# Patient Record
Sex: Female | Born: 1935 | Race: White | Hispanic: No | Marital: Married | Smoking: Never smoker
Health system: Southern US, Community
[De-identification: ages and names within clinical notes are randomized; demographics above are authoritative.]

## PROBLEM LIST (undated history)

## (undated) DIAGNOSIS — D509 Iron deficiency anemia, unspecified: Secondary | ICD-10-CM

## (undated) DIAGNOSIS — I272 Pulmonary hypertension, unspecified: Secondary | ICD-10-CM

## (undated) DIAGNOSIS — I714 Abdominal aortic aneurysm, without rupture, unspecified: Secondary | ICD-10-CM

## (undated) DIAGNOSIS — G4700 Insomnia, unspecified: Secondary | ICD-10-CM

## (undated) DIAGNOSIS — N189 Chronic kidney disease, unspecified: Secondary | ICD-10-CM

## (undated) DIAGNOSIS — M47816 Spondylosis without myelopathy or radiculopathy, lumbar region: Secondary | ICD-10-CM

## (undated) DIAGNOSIS — D649 Anemia, unspecified: Secondary | ICD-10-CM

## (undated) DIAGNOSIS — D259 Leiomyoma of uterus, unspecified: Secondary | ICD-10-CM

## (undated) DIAGNOSIS — E782 Mixed hyperlipidemia: Secondary | ICD-10-CM

## (undated) DIAGNOSIS — I7090 Unspecified atherosclerosis: Secondary | ICD-10-CM

## (undated) DIAGNOSIS — E038 Other specified hypothyroidism: Secondary | ICD-10-CM

## (undated) DIAGNOSIS — M479 Spondylosis, unspecified: Secondary | ICD-10-CM

## (undated) HISTORY — DX: Anemia, unspecified: D64.9

## (undated) HISTORY — DX: Abdominal aortic aneurysm, without rupture, unspecified: I71.40

## (undated) HISTORY — DX: Other specified hypothyroidism: E03.8

## (undated) HISTORY — DX: Iron deficiency anemia, unspecified: D50.9

## (undated) HISTORY — DX: Chronic kidney disease, unspecified: N18.9

## (undated) HISTORY — DX: Spondylosis without myelopathy or radiculopathy, lumbar region: M47.816

## (undated) HISTORY — DX: Leiomyoma of uterus, unspecified: D25.9

## (undated) HISTORY — DX: Insomnia, unspecified: G47.00

## (undated) HISTORY — PX: NO PAST SURGERIES: SHX2092

## (undated) HISTORY — DX: Mixed hyperlipidemia: E78.2

## (undated) HISTORY — DX: Spondylosis, unspecified: M47.9

## (undated) HISTORY — DX: Pulmonary hypertension, unspecified: I27.20

## (undated) HISTORY — DX: Unspecified atherosclerosis: I70.90

---

## 2015-08-06 DIAGNOSIS — R5381 Other malaise: Secondary | ICD-10-CM

## 2015-08-06 DIAGNOSIS — I10 Essential (primary) hypertension: Secondary | ICD-10-CM

## 2015-08-06 HISTORY — DX: Other malaise: R53.81

## 2015-08-06 HISTORY — DX: Essential (primary) hypertension: I10

## 2016-02-26 DIAGNOSIS — R634 Abnormal weight loss: Secondary | ICD-10-CM

## 2016-02-26 HISTORY — DX: Abnormal weight loss: R63.4

## 2018-03-15 DIAGNOSIS — K219 Gastro-esophageal reflux disease without esophagitis: Secondary | ICD-10-CM

## 2018-03-15 HISTORY — DX: Gastro-esophageal reflux disease without esophagitis: K21.9

## 2018-03-17 DIAGNOSIS — E038 Other specified hypothyroidism: Secondary | ICD-10-CM | POA: Insufficient documentation

## 2020-07-29 DIAGNOSIS — I700 Atherosclerosis of aorta: Secondary | ICD-10-CM

## 2020-07-29 HISTORY — DX: Atherosclerosis of aorta: I70.0

## 2020-08-13 DIAGNOSIS — M51369 Other intervertebral disc degeneration, lumbar region without mention of lumbar back pain or lower extremity pain: Secondary | ICD-10-CM | POA: Insufficient documentation

## 2020-08-13 DIAGNOSIS — M5136 Other intervertebral disc degeneration, lumbar region: Secondary | ICD-10-CM | POA: Insufficient documentation

## 2020-08-13 DIAGNOSIS — F419 Anxiety disorder, unspecified: Secondary | ICD-10-CM

## 2020-08-13 DIAGNOSIS — F32A Depression, unspecified: Secondary | ICD-10-CM

## 2020-08-13 HISTORY — DX: Depression, unspecified: F32.A

## 2020-08-13 HISTORY — DX: Anxiety disorder, unspecified: F41.9

## 2020-08-13 HISTORY — DX: Other intervertebral disc degeneration, lumbar region without mention of lumbar back pain or lower extremity pain: M51.369

## 2020-08-16 ENCOUNTER — Emergency Department (HOSPITAL_COMMUNITY)
Admission: EM | Admit: 2020-08-16 | Discharge: 2020-08-17 | Disposition: A | Discharge status: Home / Self Care | Payer: Medicare HMO | Attending: Emergency Medicine | Admitting: Emergency Medicine

## 2020-08-16 ENCOUNTER — Emergency Department (HOSPITAL_COMMUNITY): Payer: Medicare HMO

## 2020-08-16 DIAGNOSIS — R42 Dizziness and giddiness: Secondary | ICD-10-CM | POA: Insufficient documentation

## 2020-08-16 DIAGNOSIS — R5383 Other fatigue: Secondary | ICD-10-CM | POA: Insufficient documentation

## 2020-08-16 DIAGNOSIS — R002 Palpitations: Secondary | ICD-10-CM | POA: Diagnosis not present

## 2020-08-16 LAB — CBC WITH DIFFERENTIAL/PLATELET
Abs Immature Granulocytes: 0.02 10*3/uL (ref 0.00–0.07)
Basophils Absolute: 0.1 10*3/uL (ref 0.0–0.1)
Basophils Relative: 1 %
Eosinophils Absolute: 0 10*3/uL (ref 0.0–0.5)
Eosinophils Relative: 1 %
HCT: 37.8 % (ref 36.0–46.0)
Hemoglobin: 12.6 g/dL (ref 12.0–15.0)
Immature Granulocytes: 0 %
Lymphocytes Relative: 16 %
Lymphs Abs: 1.1 10*3/uL (ref 0.7–4.0)
MCH: 30.4 pg (ref 26.0–34.0)
MCHC: 33.3 g/dL (ref 30.0–36.0)
MCV: 91.1 fL (ref 80.0–100.0)
Monocytes Absolute: 0.4 10*3/uL (ref 0.1–1.0)
Monocytes Relative: 6 %
Neutro Abs: 5.3 10*3/uL (ref 1.7–7.7)
Neutrophils Relative %: 76 %
Platelets: 193 10*3/uL (ref 150–400)
RBC: 4.15 MIL/uL (ref 3.87–5.11)
RDW: 12.8 % (ref 11.5–15.5)
WBC: 7 10*3/uL (ref 4.0–10.5)
nRBC: 0 % (ref 0.0–0.2)

## 2020-08-16 LAB — CBC
HCT: 37.2 % (ref 36.0–46.0)
Hemoglobin: 12.3 g/dL (ref 12.0–15.0)
MCH: 30.3 pg (ref 26.0–34.0)
MCHC: 33.1 g/dL (ref 30.0–36.0)
MCV: 91.6 fL (ref 80.0–100.0)
Platelets: 7 10*3/uL — CL (ref 150–400)
RBC: 4.06 MIL/uL (ref 3.87–5.11)
RDW: 12.6 % (ref 11.5–15.5)
WBC: 3.2 10*3/uL — ABNORMAL LOW (ref 4.0–10.5)
nRBC: 0 % (ref 0.0–0.2)

## 2020-08-16 LAB — BASIC METABOLIC PANEL
Anion gap: 6 (ref 5–15)
BUN: 25 mg/dL — ABNORMAL HIGH (ref 8–23)
CO2: 24 mmol/L (ref 22–32)
Calcium: 8.7 mg/dL — ABNORMAL LOW (ref 8.9–10.3)
Chloride: 108 mmol/L (ref 98–111)
Creatinine, Ser: 0.91 mg/dL (ref 0.44–1.00)
GFR, Estimated: >60 mL/min (ref >60)
Glucose, Bld: 159 mg/dL — ABNORMAL HIGH (ref 70–99)
Potassium: 4.1 mmol/L (ref 3.5–5.1)
Sodium: 138 mmol/L (ref 135–145)

## 2020-08-16 LAB — TROPONIN I (HIGH SENSITIVITY)
Troponin I (High Sensitivity): 8 ng/L (ref <18)
Troponin I (High Sensitivity): 13 ng/L (ref <18)

## 2020-08-16 LAB — CBG MONITORING, ED: Glucose-Capillary: 137 mg/dL — ABNORMAL HIGH (ref 70–99)

## 2020-08-16 MED ORDER — SODIUM CHLORIDE 0.9 % IV SOLN: INTRAVENOUS | Status: DC

## 2020-08-16 MED ORDER — SODIUM CHLORIDE 0.9 % IV BOLUS
1000.0000 mL | Freq: Once | INTRAVENOUS | Status: AC
  Administered 2020-08-16: 1000 mL via INTRAVENOUS

## 2020-08-16 NOTE — ED Notes (Signed)
Pure wick placed.

## 2020-08-16 NOTE — Discharge Instructions (Addendum)
Your blood work here was reassuring.  I suspect that you had a vagal episode at home.  The initial ambulance EKG showed some concerning changes while your blood pressure is low.  By the time he got here, both your blood pressure and the EKG were completely normal.  Your cardiac work-up here was also reassuring, as we discussed.  We talked about either being admitted to the hospital for stress testing here or going home with cardiology follow-up and getting stress testing as an outpatient.  He wished to go home, which in the setting of your normal EKG here and reassuring work-up I believe is reasonable.  I have placed an ambulatory referral to cardiology in [place], but I would also call their office yourself to try to establish follow-up as soon as possible.  Please come back to the emergency department if you develop chest pain or shortness of breath.

## 2020-08-16 NOTE — ED Triage Notes (Signed)
BIB [facility] after reporting generalized weakness and heart palpitations. EMS reports initial pressure of 94 palpated and pt was pale. No chest pain but EMS noted depression in leads 4,5 and 6. Code STEMI called and in coded. EMS administered 324mg  aspirin and 1L NS.   Pt A&Ox4 and reports no chest pain at this time. Bilateral 18 g Ivs placed by EMS. Last VS 188/84, HR 94, CBG 201. No significant medical hx, allergies, medications.

## 2020-08-16 NOTE — ED Provider Notes (Signed)
[facility] EMERGENCY DEPARTMENT Provider Note   CSN: [id number] Arrival date & time: 08/16/20  2040     History Chief Complaint  Patient presents with   Code STEMI    Rachel Bass is a 86 y.o. female.  HPI  86 year old female with no reported past medical history presenting to the emergency department due to concern for a myocardial infarction.  Patient reports that her husband recently left the hospital.  She states that she has been caring for him a great deal at home.  She states that this is made her quite tired, especially in the last 2 weeks.  She states that today, she did not eat or drink very well.  She states that when she stood up, she felt her heart was racing, she felt flushed, and she felt she was going to pass out.  She states that she told her daughter that she felt she was dying, so her daughter called 911.  EMS report that on their arrival, the patient was pale and her blood pressure was 95 systolic.  They report that their first EKG was concerning for ST depressions with possibly some elevation, but after they reported to their dispatcher no code STEMI was activated.  Currently the emergency department, patient states that she feels quite well.  She feels much better after EMS gave her some fluids.  EMS also gave her 324 mg of aspirin.  Patient reports that she feels slightly tired, but overall feels much improved.  She denies any chest pain, shortness of breath.  She denies any abdominal pain, nausea, vomiting, or diarrhea.  No past medical history on file.  There are no problems to display for this patient.  OB History   No obstetric history on file.    No family history on file.   Home Medications Prior to Admission medications   Not on File    Allergies    Patient has no allergy information on record.  Review of Systems   Review of Systems  Constitutional:  Positive for chills. Negative for fever.  HENT:  Negative for ear pain and  sore throat.   Eyes:  Negative for pain and visual disturbance.  Respiratory:  Negative for cough and shortness of breath.   Cardiovascular:  Positive for palpitations. Negative for chest pain.  Gastrointestinal:  Negative for abdominal pain and vomiting.  Genitourinary:  Negative for dysuria and hematuria.  Musculoskeletal:  Negative for arthralgias and back pain.  Skin:  Negative for color change and rash.  Neurological:  Positive for light-headedness. Negative for seizures and syncope.  All other systems reviewed and are negative.  Physical Exam Updated Vital Signs BP (!) 189/82   Pulse 66   Temp 98.7 F (37.1 C) (Oral)   Resp 18   SpO2 96%   Physical Exam Vitals and nursing note reviewed.  Constitutional:      General: She is not in acute distress.    Appearance: She is well-developed. She is not ill-appearing or toxic-appearing.  HENT:     Head: Normocephalic and atraumatic.  Eyes:     Conjunctiva/sclera: Conjunctivae normal.  Cardiovascular:     Rate and Rhythm: Normal rate and regular rhythm.     Heart sounds: No murmur heard. Pulmonary:     Effort: Pulmonary effort is normal. No respiratory distress.     Breath sounds: Normal breath sounds.  Abdominal:     Palpations: Abdomen is soft.     Tenderness: There is no abdominal  tenderness.  Musculoskeletal:     Cervical back: Neck supple.  Skin:    General: Skin is warm and dry.     Capillary Refill: Capillary refill takes less than 2 seconds.  Neurological:     Mental Status: She is alert and oriented to person, place, and time.   ED Results / Procedures / Treatments   Labs (all labs ordered are listed, but only abnormal results are displayed) Labs Reviewed  BASIC METABOLIC PANEL - Abnormal; Notable for the following components:      Result Value   Glucose, Bld 159 (*)    BUN 25 (*)    Calcium 8.7 (*)    All other components within normal limits  CBC - Abnormal; Notable for the following components:   WBC  3.2 (*)    Platelets 7 (*)    All other components within normal limits  CBG MONITORING, ED - Abnormal; Notable for the following components:   Glucose-Capillary 137 (*)    All other components within normal limits  CBC WITH DIFFERENTIAL/PLATELET  TROPONIN I (HIGH SENSITIVITY)  TROPONIN I (HIGH SENSITIVITY)   EKG EKG Interpretation  Date/Time:  Saturday August 16 2020 20:46:32 EDT Ventricular Rate:  89 PR Interval:  198 QRS Duration: 102 QT Interval:  348 QTC Calculation: 424 R Axis:   65 Text Interpretation: Sinus rhythm Confirmed by Alvester Chou [phone]) on 08/16/2020 9:49:09 PM  Radiology DG Chest Portable 1 View  Result Date: 08/16/2020 CLINICAL DATA:  Code STEMI EXAM: PORTABLE CHEST 1 VIEW COMPARISON:  06/26/2020 FINDINGS: Lungs are clear.  No pleural effusion or pneumothorax. The heart is normal in size. IMPRESSION: No evidence of acute cardiopulmonary disease. Electronically Signed   By: Charline Bills M.D.   On: 08/16/2020 21:26    Procedures Procedures   Medications Ordered in ED Medications  sodium chloride 0.9 % bolus 1,000 mL (0 mLs Intravenous Stopped 08/16/20 2242)    And  0.9 %  sodium chloride infusion ( Intravenous New Bag/Given 08/16/20 2242)   ED Course  I have reviewed the triage vital signs and the nursing notes.  Pertinent labs & imaging results that were available during my care of the patient were reviewed by me and considered in my medical decision making (see chart for details).  MDM Rules/Calculators/A&P                           86 year old female with no reported past medical history presenting to the emergency department with generalized fatigue and malaise for the past 2 weeks in the setting of caring for her husband.  When she stood up today, she felt palpitations, lightheaded.  EMS arrived and reported some hypotension and a flushed appearance.  He also reports some possible ST depressions in the lateral leads on initial ECG.  On arrival  to our ED, vital signs reviewed she is normotensive and not tachycardic.  She appears overall well.  She states that she is currently asymptomatic.  We will obtain a CBC, BMP, delta troponin.  Initial EKG shows no anatomic ST elevation or ST depression that would be concerning for acute ischemia.  Will administer some fluids as well, as it improve the patient's symptoms earlier.  Differentials considered include ACS, vasovagal episode, systemic infection.  She had her thyroid checked 3 days ago, and it was normal.  Given that she has no chest pain, no tachycardia, no hypoxia, no historical risk factors, no concern for pulmonary embolism.  Initial CBC shows a platelet count of 7, but 3 days ago was 240 and I suspect that this is a spurious value.  We will resend a CBC.  Metabolic panel within acceptable limits.  Initial troponin negative.  Repeat troponin negative.  On my repeat evaluation, the patient remains asymptomatic.  She never had any chest pain or shortness of breath.  Given that the patient had some changes in the lateral leads during a likely vagal episode at home, I offered the patient admission to the hospital for provocative testing as she has never had a cardiac evaluation that I can see in our system.  The patient did not wish to be admitted and preferred to go home.  Given her reassuring cardiac work-up here and the fact that she never had any chest pain or shortness of breath, I believe that this is reasonable.  Patient reports that her husband follows with a cardiologist in [place] and she would like to call them to be seen this week.  I believe that discharge home with close specialty follow-up is a reasonable plan given her reassuring work-up and presentation.  I have also placed an ambulatory care referral as well to cardiology.  Strict return precautions discussed with the patient, given both verbal and written format.  Final Clinical Impression(s) / ED Diagnoses Final diagnoses:   Lightheadedness    Rx / DC Orders ED Discharge Orders     None        Lenard Lance, MD 08/17/20 0010    Terald Sleeper, MD 08/17/20 [phone]

## 2020-08-17 NOTE — ED Notes (Signed)
E-signature pad unavailable at time of pt discharge. This RN discussed discharge materials with pt and answered all pt questions. Pt stated understanding of discharge material. ? ?

## 2020-08-17 NOTE — ED Notes (Signed)
Daughter-in-law on the way from [place] to pick up pt.

## 2020-08-17 NOTE — ED Notes (Signed)
RN attempted to call Fayrene Fearing, pt's husband, per pt discharge. Left a voicemail to return a call to the [facility] and provided the respective phone number.

## 2020-08-17 NOTE — ED Notes (Signed)
Attempted to call Marylene Land, pt's daughter.

## 2020-08-17 NOTE — ED Notes (Signed)
Daughter called and is unable to pick up the patient anymore. Called husband. No answer; voicemail left.

## 2020-08-17 NOTE — ED Notes (Signed)
RN spoke with Marylene Land, pt's daughter, on the phone per pt discharge. Daughter stated to this RN that she will come and pick up pt.

## 2020-08-22 ENCOUNTER — Emergency Department (HOSPITAL_COMMUNITY)
Admission: EM | Admit: 2020-08-22 | Discharge: 2020-08-23 | Disposition: A | Discharge status: Home / Self Care | Payer: Medicare HMO | Attending: Emergency Medicine | Admitting: Emergency Medicine

## 2020-08-22 ENCOUNTER — Other Ambulatory Visit: Payer: Self-pay

## 2020-08-22 ENCOUNTER — Emergency Department (HOSPITAL_COMMUNITY): Payer: Medicare HMO

## 2020-08-22 ENCOUNTER — Encounter (HOSPITAL_COMMUNITY): Payer: Self-pay

## 2020-08-22 DIAGNOSIS — Y92002 Bathroom of unspecified non-institutional (private) residence single-family (private) house as the place of occurrence of the external cause: Secondary | ICD-10-CM | POA: Insufficient documentation

## 2020-08-22 DIAGNOSIS — S0990XA Unspecified injury of head, initial encounter: Secondary | ICD-10-CM | POA: Diagnosis not present

## 2020-08-22 DIAGNOSIS — R55 Syncope and collapse: Secondary | ICD-10-CM

## 2020-08-22 DIAGNOSIS — S4992XA Unspecified injury of left shoulder and upper arm, initial encounter: Secondary | ICD-10-CM | POA: Diagnosis present

## 2020-08-22 DIAGNOSIS — S42412A Displaced simple supracondylar fracture without intercondylar fracture of left humerus, initial encounter for closed fracture: Secondary | ICD-10-CM | POA: Diagnosis not present

## 2020-08-22 DIAGNOSIS — Z20822 Contact with and (suspected) exposure to covid-19: Secondary | ICD-10-CM | POA: Diagnosis not present

## 2020-08-22 DIAGNOSIS — M25522 Pain in left elbow: Secondary | ICD-10-CM | POA: Diagnosis not present

## 2020-08-22 DIAGNOSIS — I951 Orthostatic hypotension: Secondary | ICD-10-CM | POA: Diagnosis not present

## 2020-08-22 DIAGNOSIS — W1812XA Fall from or off toilet with subsequent striking against object, initial encounter: Secondary | ICD-10-CM | POA: Diagnosis not present

## 2020-08-22 DIAGNOSIS — S098XXA Other specified injuries of head, initial encounter: Secondary | ICD-10-CM

## 2020-08-22 DIAGNOSIS — W19XXXA Unspecified fall, initial encounter: Secondary | ICD-10-CM

## 2020-08-22 LAB — BASIC METABOLIC PANEL
Anion gap: 9 (ref 5–15)
BUN: 23 mg/dL (ref 8–23)
CO2: 24 mmol/L (ref 22–32)
Calcium: 9 mg/dL (ref 8.9–10.3)
Chloride: 100 mmol/L (ref 98–111)
Creatinine, Ser: 0.92 mg/dL (ref 0.44–1.00)
GFR, Estimated: >60 mL/min (ref >60)
Glucose, Bld: 99 mg/dL (ref 70–99)
Potassium: 3.4 mmol/L — ABNORMAL LOW (ref 3.5–5.1)
Sodium: 133 mmol/L — ABNORMAL LOW (ref 135–145)

## 2020-08-22 LAB — CBC
HCT: 41.5 % (ref 36.0–46.0)
Hemoglobin: 14.1 g/dL (ref 12.0–15.0)
MCH: 30.6 pg (ref 26.0–34.0)
MCHC: 34 g/dL (ref 30.0–36.0)
MCV: 90 fL (ref 80.0–100.0)
Platelets: 198 10*3/uL (ref 150–400)
RBC: 4.61 MIL/uL (ref 3.87–5.11)
RDW: 12.5 % (ref 11.5–15.5)
WBC: 8.4 10*3/uL (ref 4.0–10.5)
nRBC: 0 % (ref 0.0–0.2)

## 2020-08-22 LAB — RESP PANEL BY RT-PCR (FLU A&B, COVID) ARPGX2
Influenza A by PCR: NEGATIVE
Influenza B by PCR: NEGATIVE
SARS Coronavirus 2 by RT PCR: NEGATIVE

## 2020-08-22 LAB — TROPONIN I (HIGH SENSITIVITY): Troponin I (High Sensitivity): 20 ng/L — ABNORMAL HIGH (ref <18)

## 2020-08-22 MED ORDER — SODIUM CHLORIDE 0.9 % IV BOLUS
500.0000 mL | Freq: Once | INTRAVENOUS | Status: AC
  Administered 2020-08-22: 500 mL via INTRAVENOUS

## 2020-08-22 MED ORDER — ACETAMINOPHEN 325 MG PO TABS
650.0000 mg | ORAL_TABLET | Freq: Once | ORAL | Status: AC
  Administered 2020-08-22: 650 mg via ORAL
  Filled 2020-08-22: qty 2

## 2020-08-22 NOTE — ED Notes (Signed)
Transported to radiology at this time 

## 2020-08-22 NOTE — ED Provider Notes (Signed)
[facility] EMERGENCY DEPARTMENT Provider Note   CSN: [id number] Arrival date & time: 08/22/20  1833     History Chief Complaint  Patient presents with   Near Syncope    Rachel Bass is a 86 y.o. female.  Patient is a 86 yo female presenting for fall. Patient states she stood up from the seated position, attempted to walk to the bathroom, when she felt light headed, dizzy, and fell to the ground. Denies LOC. Admits to head trauma, fell backwards, with pain at back of head. No blood thinners. Admits to pain in left elbow. Patient states over the past three weeks she has had intermittent dizziness with standing and ambulating. States her husband has been ill since December of this year and she has been worried, eating less, and has lost 32 lbs (134 to 102) in the past 8 months. Denies any abdominal pain, nausea, vomiting, night sweats, or hx of cancer. Denies black or blood stools. Denies recent URI symptoms or illness.   The history is provided by the patient. No language interpreter was used.  Near Syncope Chronicity: 3 months. The problem has been resolved. Pertinent negatives include no chest pain, no abdominal pain and no shortness of breath.      History reviewed. No pertinent past medical history.  There are no problems to display for this patient.   History reviewed. No pertinent surgical history.   OB History   No obstetric history on file.     No family history on file.  Social History   Tobacco Use   Smoking status: Never   Smokeless tobacco: Never  Substance Use Topics   Alcohol use: Never   Drug use: Never    Home Medications Prior to Admission medications   Not on File    Allergies    Patient has no known allergies.  Review of Systems   Review of Systems  Constitutional:  Negative for chills and fever.  HENT:  Negative for ear pain and sore throat.   Eyes:  Negative for pain and visual disturbance.  Respiratory:  Negative  for cough and shortness of breath.   Cardiovascular:  Positive for near-syncope. Negative for chest pain and palpitations.  Gastrointestinal:  Negative for abdominal pain and vomiting.  Genitourinary:  Negative for dysuria and hematuria.  Musculoskeletal:  Negative for arthralgias and back pain.  Skin:  Negative for color change and rash.  Neurological:  Positive for dizziness, syncope (near) and light-headedness. Negative for seizures.  All other systems reviewed and are negative.  Physical Exam Updated Vital Signs BP (!) 158/89   Pulse 87   Temp 97.8 F (36.6 C) (Oral)   Resp 14   Ht 4\' 11"  (1.499 m)   Wt 47.2 kg   SpO2 99%   BMI 21.01 kg/m   Physical Exam Vitals and nursing note reviewed.  Constitutional:      General: She is not in acute distress.    Appearance: She is well-developed.  HENT:     Head: Normocephalic and atraumatic.  Eyes:     Conjunctiva/sclera: Conjunctivae normal.  Cardiovascular:     Rate and Rhythm: Normal rate and regular rhythm.     Heart sounds: No murmur heard. Pulmonary:     Effort: Pulmonary effort is normal. No respiratory distress.     Breath sounds: Normal breath sounds.  Abdominal:     Palpations: Abdomen is soft.     Tenderness: There is no abdominal tenderness.  Musculoskeletal:  Cervical back: Neck supple.  Skin:    General: Skin is warm and dry.  Neurological:     Mental Status: She is alert.     GCS: GCS eye subscore is 4. GCS verbal subscore is 5. GCS motor subscore is 6.     Cranial Nerves: Cranial nerves are intact.     Sensory: Sensation is intact.     Motor: Motor function is intact.     Coordination: Coordination is intact.    ED Results / Procedures / Treatments   Labs (all labs ordered are listed, but only abnormal results are displayed) Labs Reviewed  RESP PANEL BY RT-PCR (FLU A&B, COVID) [id number]  BASIC METABOLIC PANEL  CBC  TROPONIN I (HIGH SENSITIVITY)    EKG None  Radiology No results  found.  Procedures Procedures   Medications Ordered in ED Medications  sodium chloride 0.9 % bolus 500 mL (has no administration in time range)    ED Course  I have reviewed the triage vital signs and the nursing notes.  Pertinent labs & imaging results that were available during my care of the patient were reviewed by me and considered in my medical decision making (see chart for details).    MDM Rules/Calculators/A&P                           7:12 PM 86 yo female presenting for episode of light headedness and dizziness that occurred upon standing resulting in fall with blunt head trauma. No blood thinners. On exam patient is Aox3, no acute distress, febrile, with stable vitals. No neurovascular deficits. No evidence of head trauma. No midline spinal pain. No gross deformities. Tenderness to palpation or left elbow.     -CT head and neck demonstrates no acute process. Stable EKG, troponin, electrolytes, and CXR. Patient's symtpoms occurred with standing after not eating well for the last 6 months due to concerns for her husbands health resulting in a 32 lb weight loss. Likely postural hypotension. Fluids given and recommendations for close follow up with cardiology. Patient has established appointment for September 9th for echocardiogram.   -Xray left elbow demonstrates minimally angulated supracondylar fracture. Posterior splint placed with sling and recommendations for close follow up with orthopedic surgery Monday morning.   Patient in no distress and overall condition improved here in the ED. Detailed discussions were had with the patient regarding current findings, and need for close f/u with PCP or on call doctor. The patient has been instructed to return immediately if the symptoms worsen in any way for re-evaluation. Patient verbalized understanding and is in agreement with current care plan. All questions answered prior to discharge.     Final Clinical Impression(s) / ED  Diagnoses Final diagnoses:  Closed supracondylar fracture of left humerus, initial encounter  Near syncope  Orthostatic hypotension  Fall, initial encounter  Blunt head trauma, initial encounter    Rx / DC Orders ED Discharge Orders     None        Franne Forts, DO 08/23/20 0105

## 2020-08-22 NOTE — ED Notes (Signed)
Ortho tech called a this time

## 2020-08-22 NOTE — ED Notes (Signed)
Received verbal report from Alana B RN at this time °

## 2020-08-22 NOTE — ED Triage Notes (Signed)
Pt arrived via [facility] for c/o dizziness. Pt was on the toilet and got dizzy and fell and hit her head and injured left shoulder. Pt denies LOC. Pt denies blood thinners. Pt is A&Ox4. Per EMS pt initial bp was 200/100. Per EMS initial EKG looked like a STEMI, but has since changed to NSR.

## 2020-08-22 NOTE — ED Notes (Signed)
Provider at bedside

## 2020-08-22 NOTE — ED Notes (Signed)
Back from radiology.

## 2020-08-22 NOTE — ED Notes (Signed)
Radiology at bedside at this time.

## 2020-08-23 LAB — TROPONIN I (HIGH SENSITIVITY): Troponin I (High Sensitivity): 20 ng/L — ABNORMAL HIGH (ref <18)

## 2020-08-23 NOTE — ED Notes (Signed)
Spoke to charge and made aware unable to reach anyone but the daughter. Pt does not qualify for PTAR. Advised to call daughter back and advised someone needed to come pick pt up

## 2020-08-23 NOTE — ED Notes (Signed)
Per charge pt d/c to waiting room waiting on transportation at this time

## 2020-08-23 NOTE — ED Notes (Signed)
Attempting to reach daughter. First call went to rang and then went to voicemail. Subsequent calls are now going directly to voicemail

## 2020-08-23 NOTE — ED Notes (Signed)
Reached daughter in reference picking up

## 2020-08-23 NOTE — ED Notes (Signed)
Ortho tech at bedside 

## 2020-08-23 NOTE — ED Notes (Signed)
Regenia Skeeter on the phone and advised that the pt was up for d/c at this time

## 2020-08-23 NOTE — Progress Notes (Signed)
Orthopedic Tech Progress Note Patient Details:  Rachel Bass 12-Mar-1934 [id number]  Ortho Devices Type of Ortho Device: Long arm splint, Shoulder immobilizer Ortho Device/Splint Location: LUE Ortho Device/Splint Interventions: Ordered, Application, Adjustment   Post Interventions Patient Tolerated: Well Instructions Provided: Adjustment of device, Care of device, Poper ambulation with device  Rachel Bass 08/23/2020, 12:27 AM

## 2020-08-23 NOTE — ED Notes (Signed)
Attempting to locate family to come pick pt up for d/c

## 2020-08-23 NOTE — ED Notes (Signed)
Advised to call daughter-in-law Larena Sox [phone]. Attempted to reach her at this time in reference to picking up pt for d/c. No answer

## 2020-08-25 DIAGNOSIS — S42402S Unspecified fracture of lower end of left humerus, sequela: Secondary | ICD-10-CM

## 2020-08-25 HISTORY — DX: Unspecified fracture of lower end of left humerus, sequela: S42.402S

## 2020-09-22 ENCOUNTER — Ambulatory Visit: Payer: Medicare HMO | Admitting: Cardiology

## 2020-09-23 DIAGNOSIS — R42 Dizziness and giddiness: Secondary | ICD-10-CM | POA: Insufficient documentation

## 2020-09-23 HISTORY — DX: Dizziness and giddiness: R42

## 2021-02-19 DIAGNOSIS — I351 Nonrheumatic aortic (valve) insufficiency: Secondary | ICD-10-CM

## 2021-02-19 DIAGNOSIS — I340 Nonrheumatic mitral (valve) insufficiency: Secondary | ICD-10-CM

## 2021-02-19 DIAGNOSIS — I361 Nonrheumatic tricuspid (valve) insufficiency: Secondary | ICD-10-CM

## 2021-12-23 IMAGING — DX DG CHEST 1V PORT
1 series · 1 of 1 positions shown · non-contrast
Comparison: 06/26/2020

CLINICAL DATA: Code STEMI

EXAM:
PORTABLE CHEST 1 VIEW

[chest]
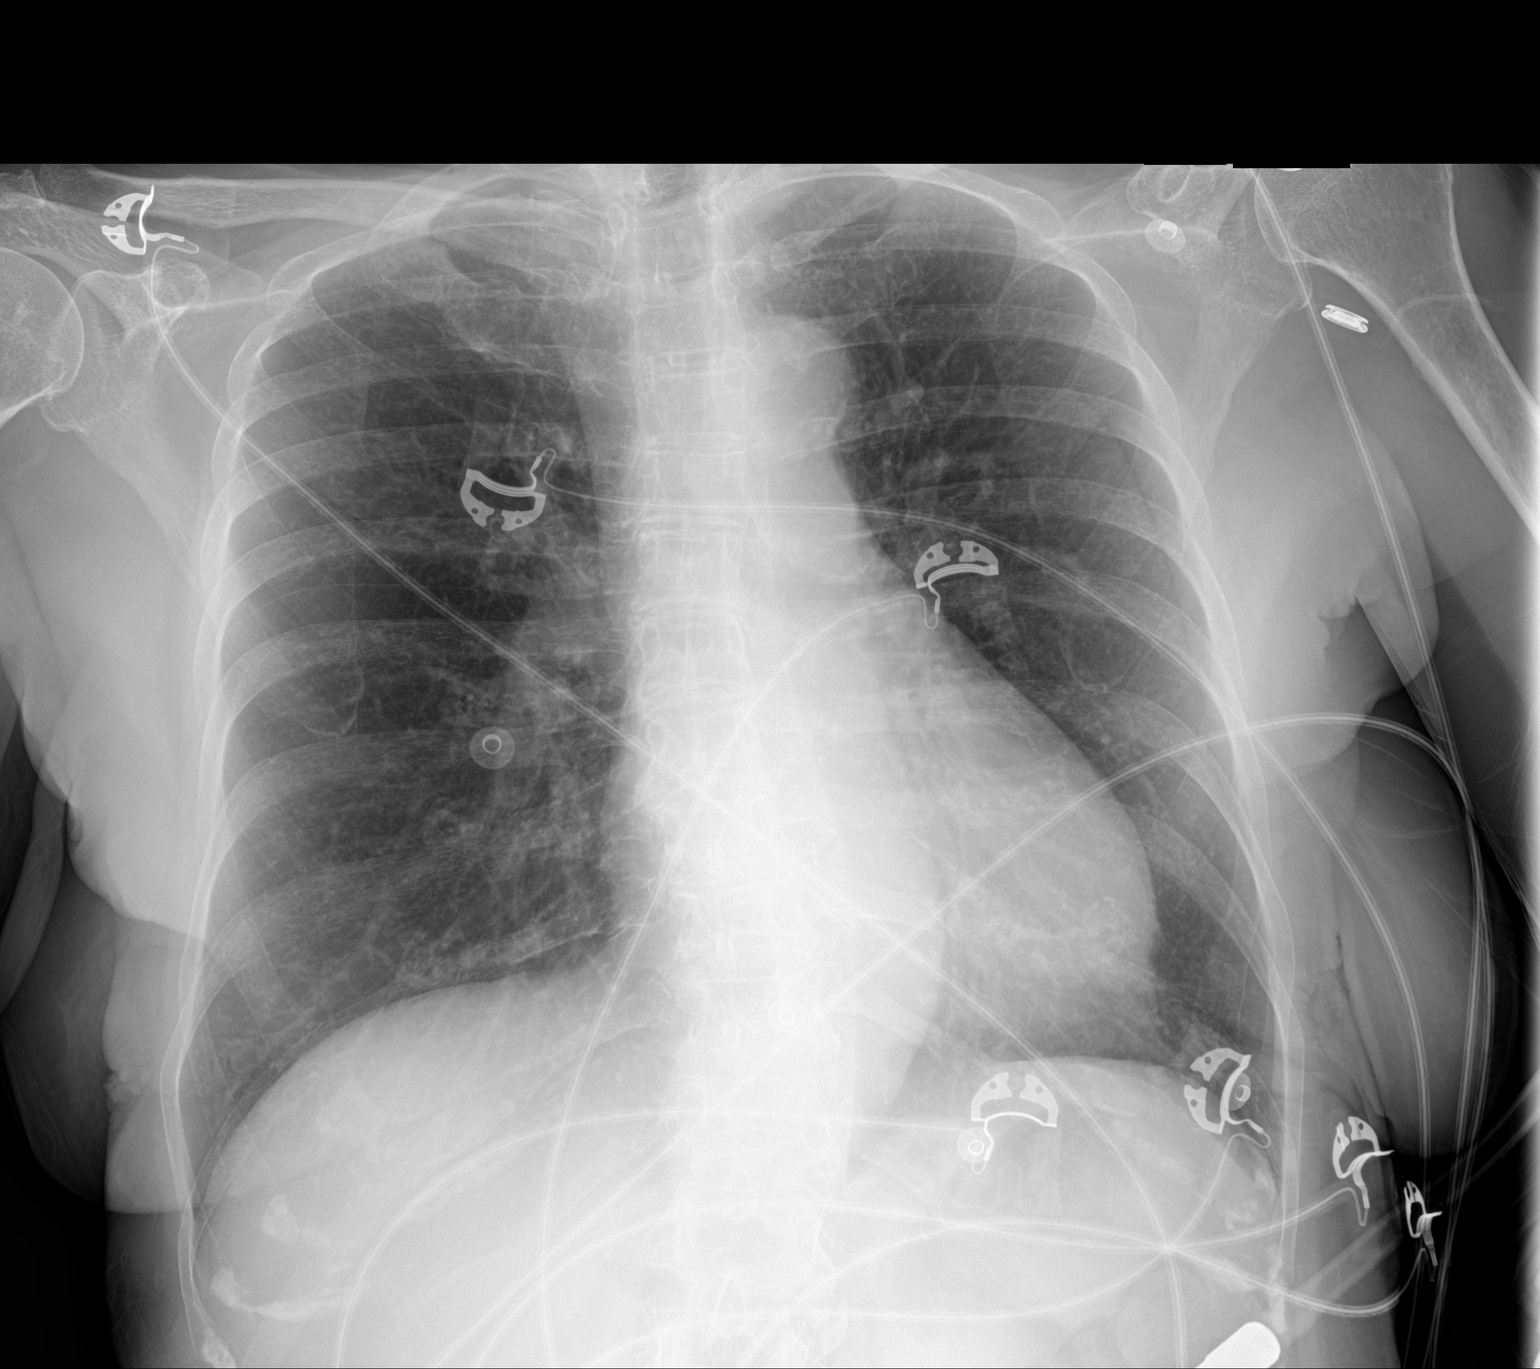

[1 of 1 positions shown; findings below may reference images not displayed]

FINDINGS: Lungs are clear.  No pleural effusion or pneumothorax.

The heart is normal in size.
IMPRESSION: No evidence of acute cardiopulmonary disease.

## 2021-12-29 IMAGING — DX DG ELBOW COMPLETE 3+V*L*
1 series · 4 of 4 positions shown · non-contrast
Comparison: None.

CLINICAL DATA: Fall and left elbow pain.

EXAM:
LEFT ELBOW - COMPLETE 3+ VIEW

[Series 1: elbow · 0.14mm/px · 4 of 4 slices shown]
[im 1/4]
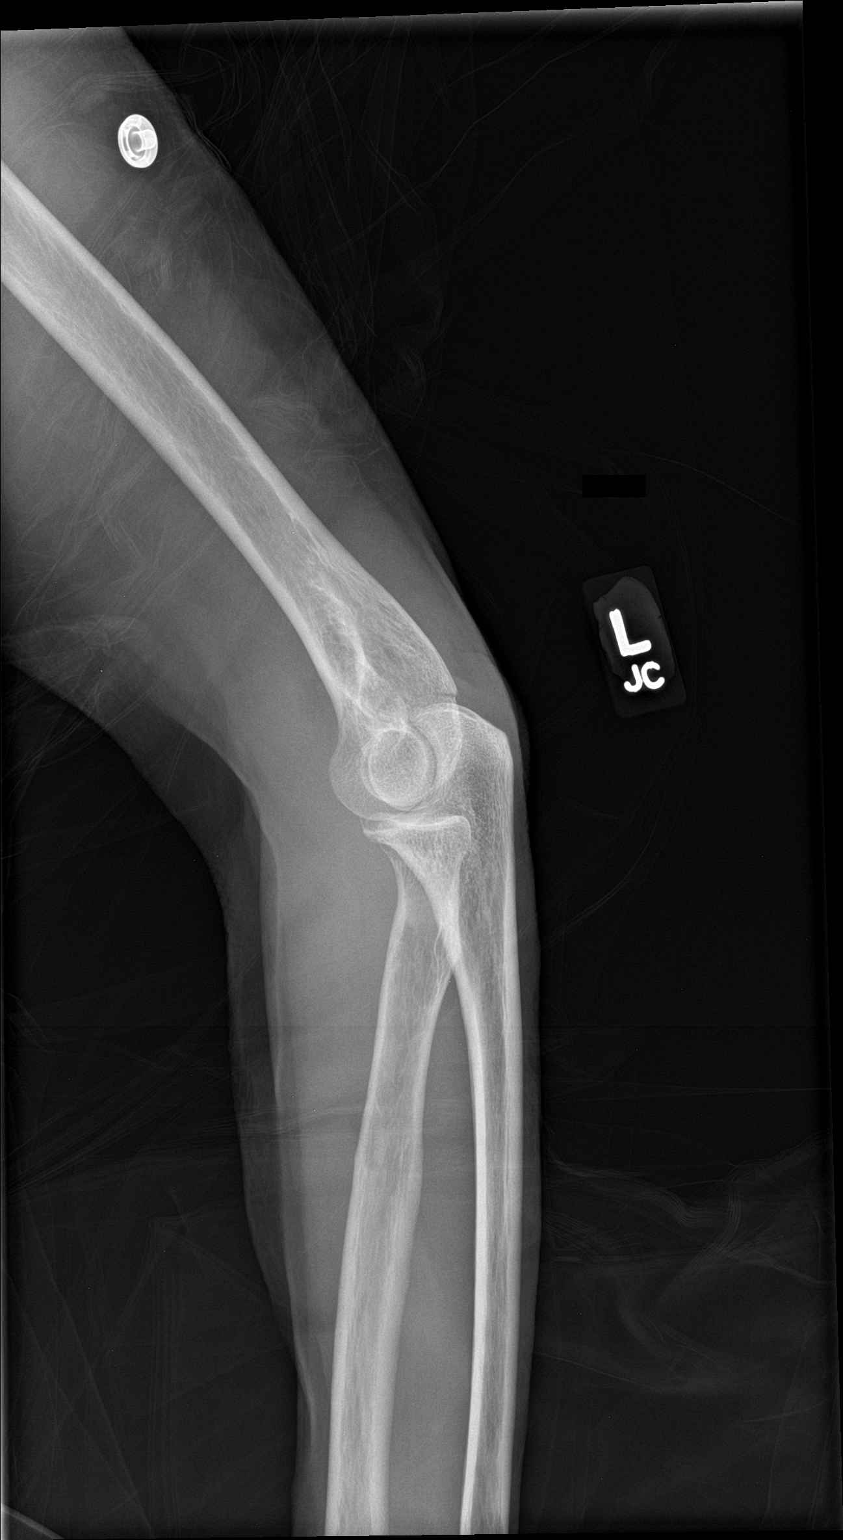
[im 2/4]
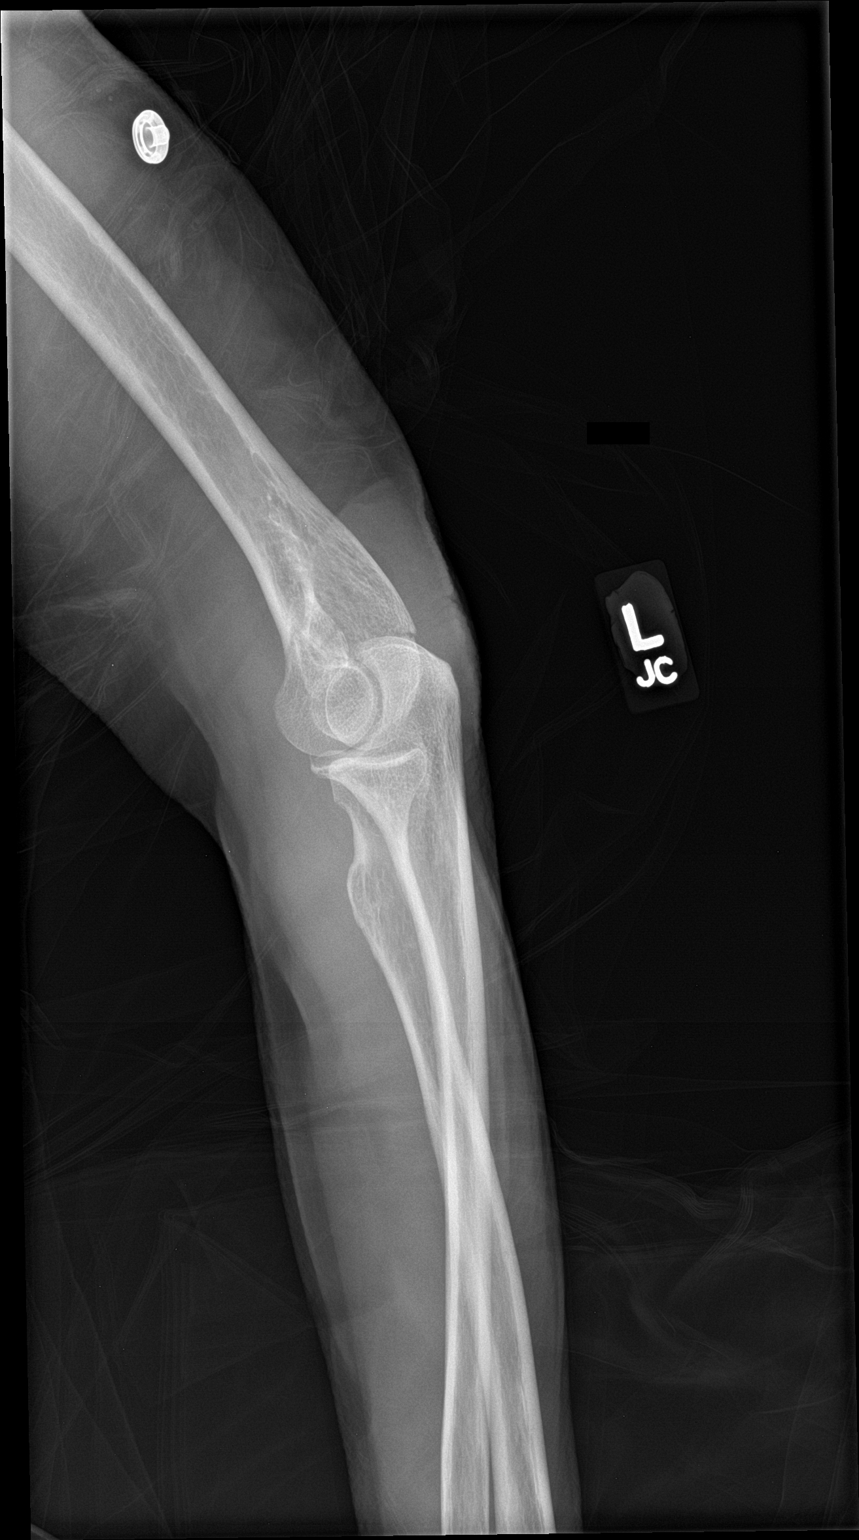
[im 3/4]
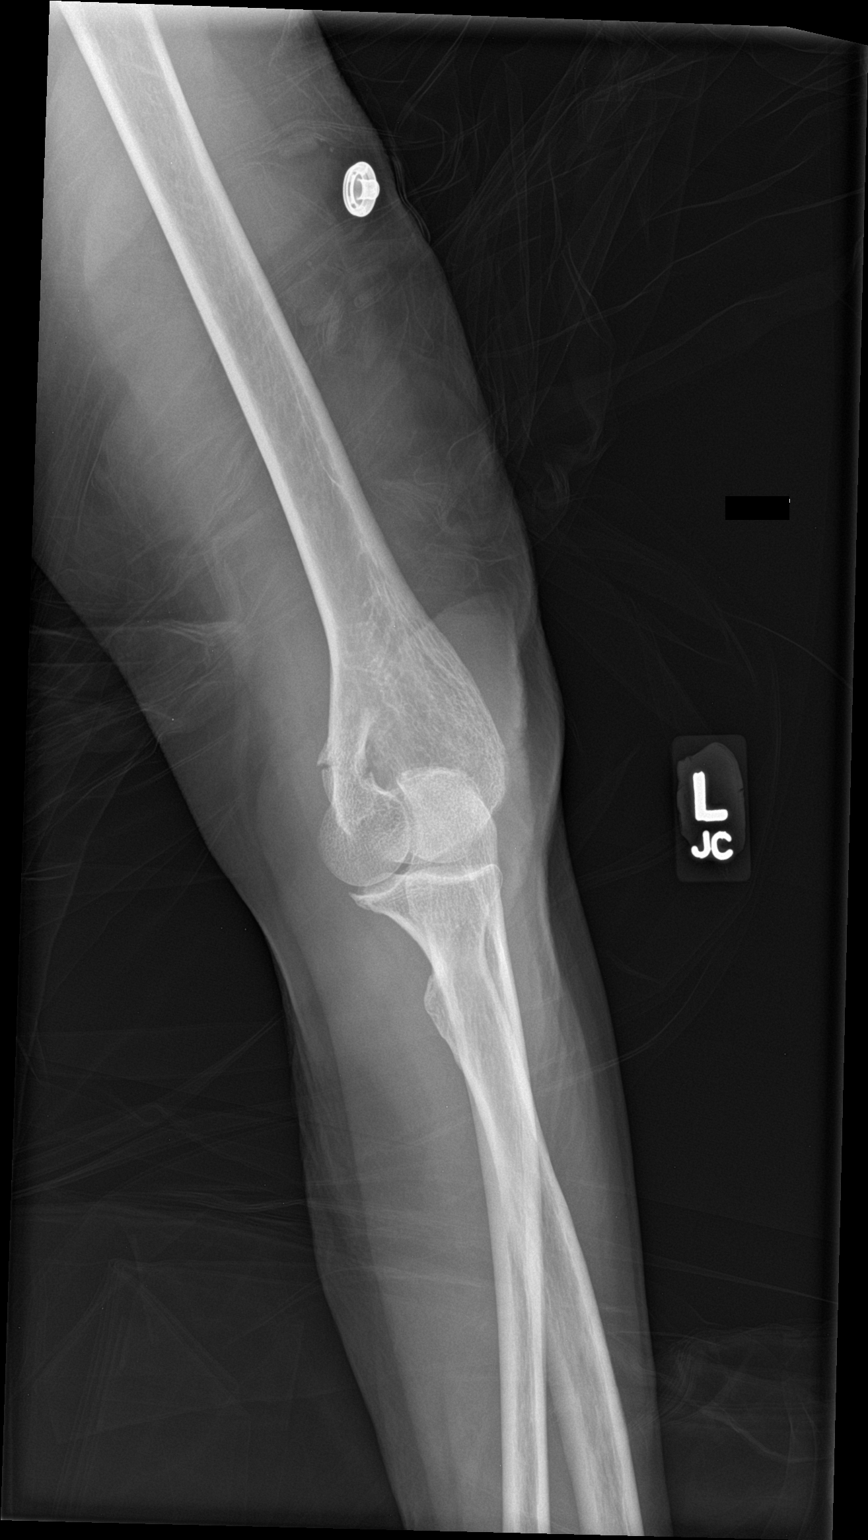
[im 4/4]
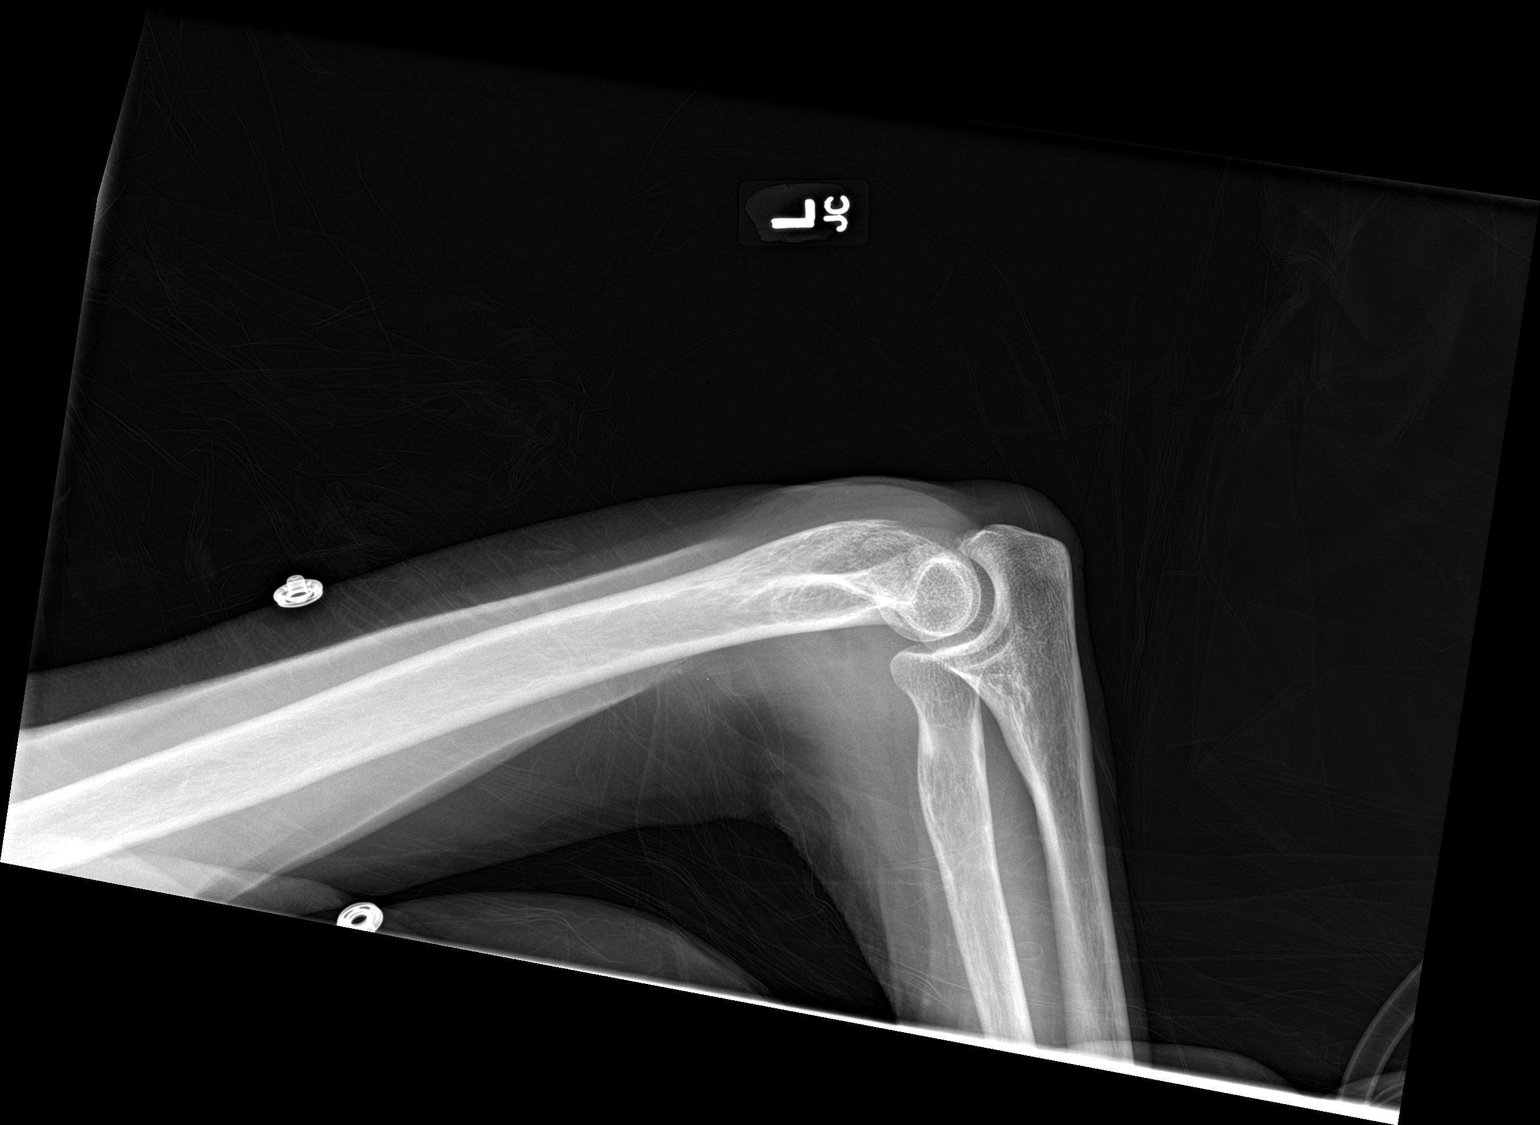

[4 of 4 positions shown; findings below may reference images not displayed]

FINDINGS: There is a supracondylar fracture with minimal volar angulation of
the distal fracture fragment. There is no dislocation. The bones are
osteopenic. There is joint effusion with elevation of anterior and
posterior fat pads. The soft tissue swelling of the elbow. No
radiopaque foreign object.
IMPRESSION: Minimally angulated supracondylar fracture.

## 2021-12-29 IMAGING — CT CT HEAD W/O CM
4 series · 17 of 47 positions shown, 19 images · non-contrast
Comparison: None.

CLINICAL DATA: Trauma

EXAM:
CT HEAD WITHOUT CONTRAST
CT CERVICAL SPINE WITHOUT CONTRAST
TECHNIQUE: Multidetector CT imaging of the head and cervical spine was
performed following the standard protocol without intravenous
contrast. Multiplanar CT image reconstructions of the cervical spine
were also generated.

[Series 2: head wo · axial · 0.39mm/px · z∈[-144,-20]mm · 7 of 35 slices shown, 9 images]
[im 5/35  brain]
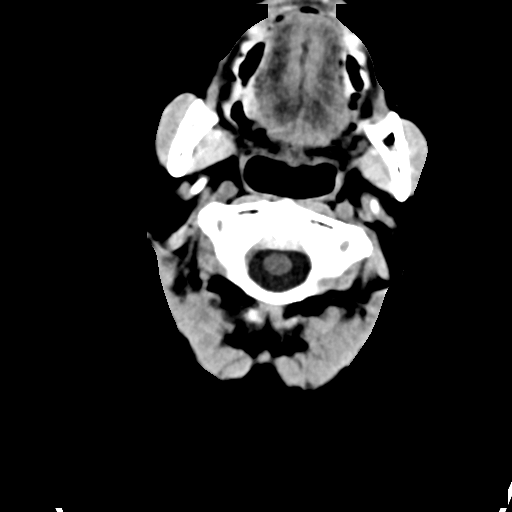
[im 5/35  bone]
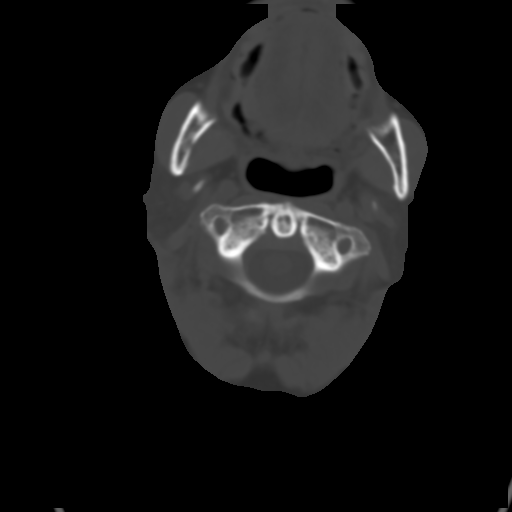
[im 9/35  brain]
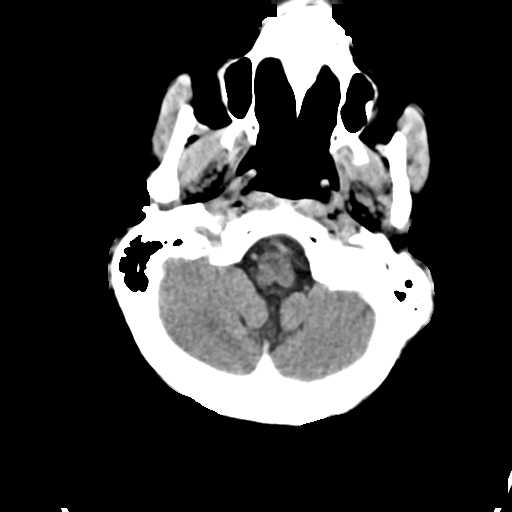
[im 13/35  brain]
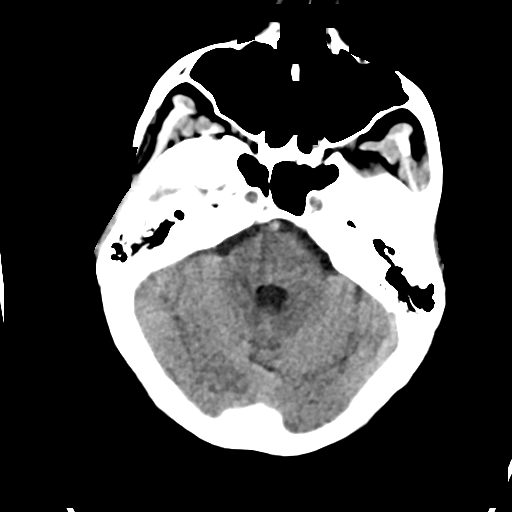
[im 18/35  brain]
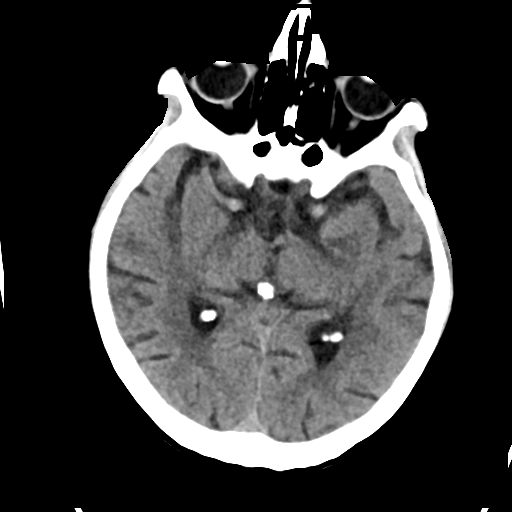
[im 22/35  brain]
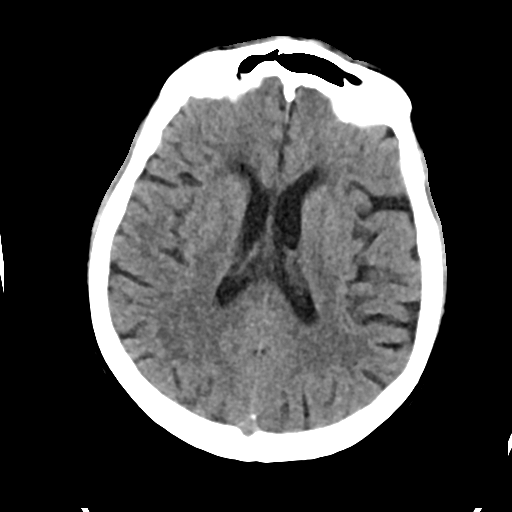
[im 22/35  bone]
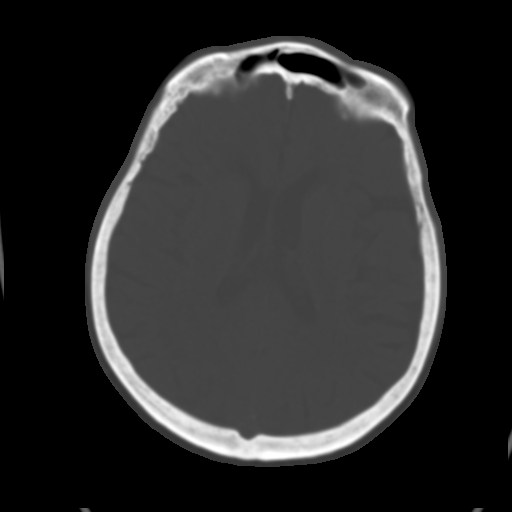
[im 26/35  brain]
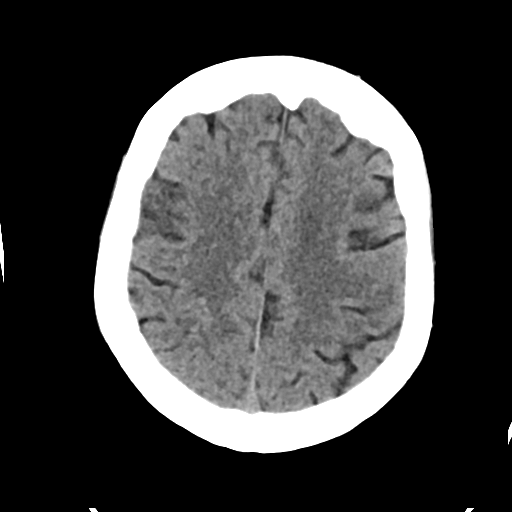
[im 30/35  brain]
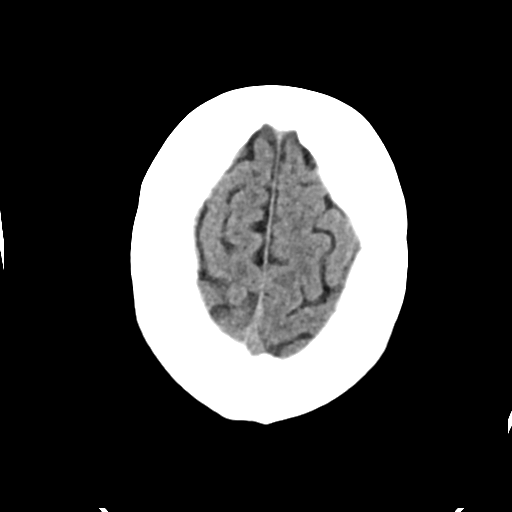

[Series 3: head bone · axial · 0.39mm/px · z∈[-148,-88]mm · 4 of 87 slices shown]
[im 9/87  bone]
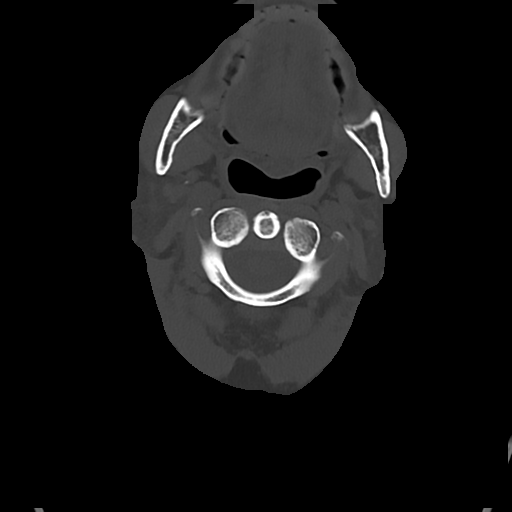
[im 18/87  bone]
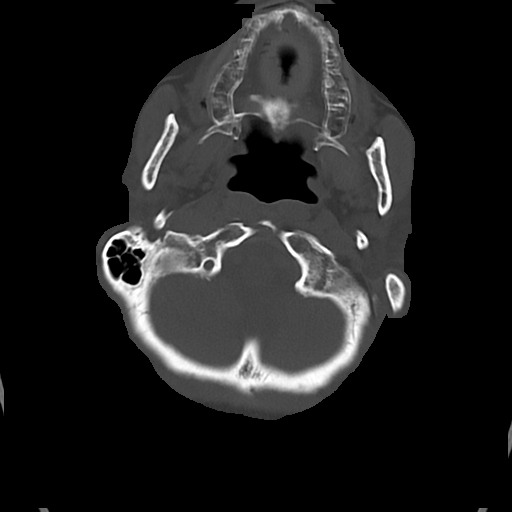
[im 26/87  bone]
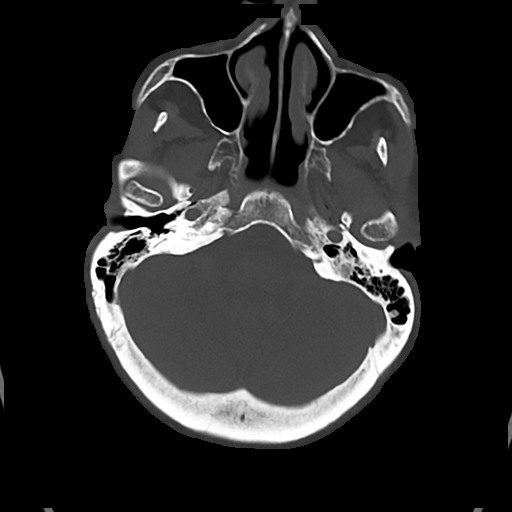
[im 39/87  bone]
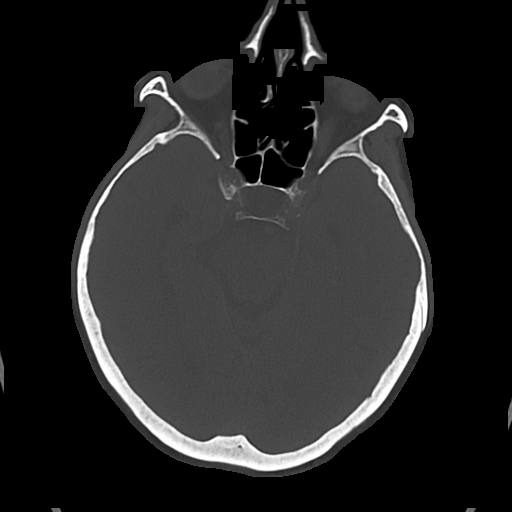

[Series 4: cor soft · coronal · 0.33mm/px · 3 of 62 slices shown]
[im 21/62  brain]
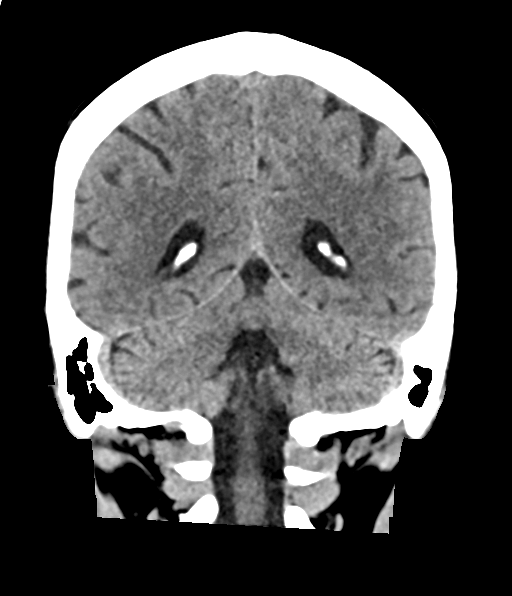
[im 28/62  brain]
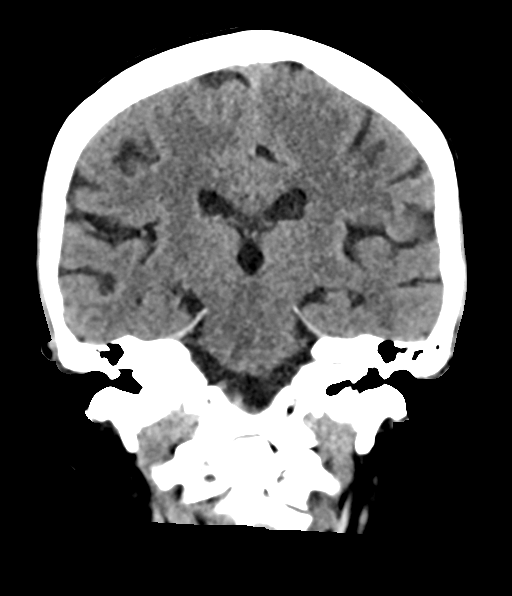
[im 34/62  brain]
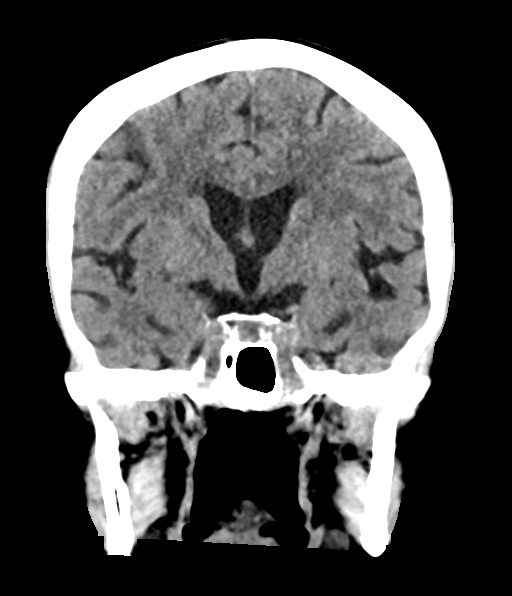

[Series 5: sag soft · sagittal · 0.38mm/px · 3 of 57 slices shown]
[im 19/57  brain]
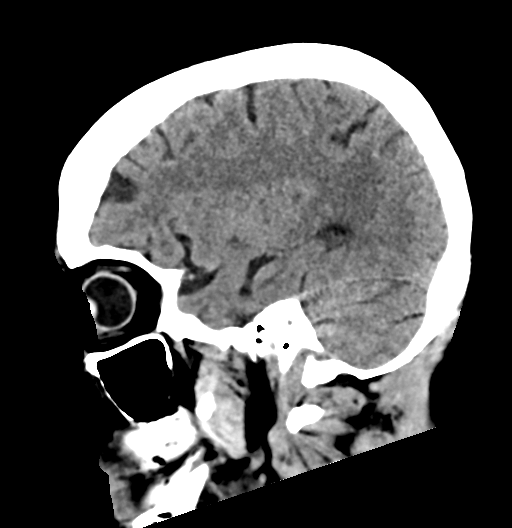
[im 29/57  brain]
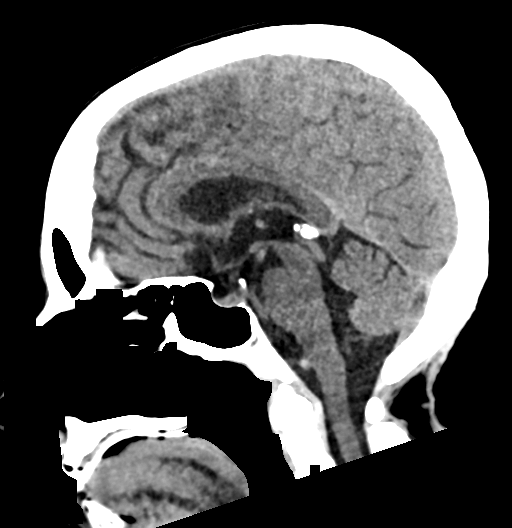
[im 38/57  brain]
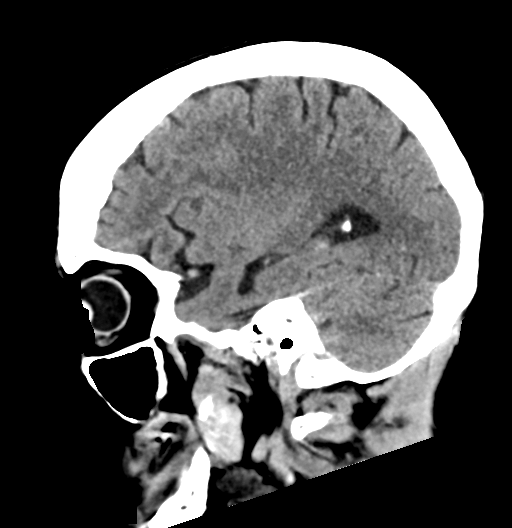

[17 of 47 positions shown; findings below may reference images not displayed]

FINDINGS: CT HEAD FINDINGS

Brain: No acute territorial infarction, hemorrhage or intracranial
mass. The ventricles are nonenlarged. Minimal chronic small vessel
ischemic change of the white matter

Vascular: No hyperdense vessels.  Carotid vascular calcification

Skull: Normal. Negative for fracture or focal lesion.

Sinuses/Orbits: No acute finding.

Other: None

CT CERVICAL SPINE FINDINGS

Alignment: No subluxation.  Facet alignment within normal limits.

Skull base and vertebrae: No acute fracture. No primary bone lesion
or focal pathologic process.

Soft tissues and spinal canal: No prevertebral fluid or swelling. No
visible canal hematoma.

Disc levels: Disc space narrowing and osteophyte C5-C6 and C6-C7.
Facet degenerative changes at multiple levels.

Upper chest: Negative.

Other: None
IMPRESSION: 1. No CT evidence for acute intracranial abnormality. Mild chronic
small vessel ischemic changes of the white matter
2. No acute osseous abnormality of the cervical spine

## 2021-12-29 IMAGING — CT CT CERVICAL SPINE W/O CM
3 series · 12 of 33 positions shown, 14 images · non-contrast
Comparison: None.

CLINICAL DATA: Trauma

EXAM:
CT HEAD WITHOUT CONTRAST
CT CERVICAL SPINE WITHOUT CONTRAST
TECHNIQUE: Multidetector CT imaging of the head and cervical spine was
performed following the standard protocol without intravenous
contrast. Multiplanar CT image reconstructions of the cervical spine
were also generated.

[Series 4: c spine soft · axial · 0.32mm/px · z∈[-250,-112]mm · 4 of 101 slices shown, 5 images]
[im 16/101  soft-tissue]
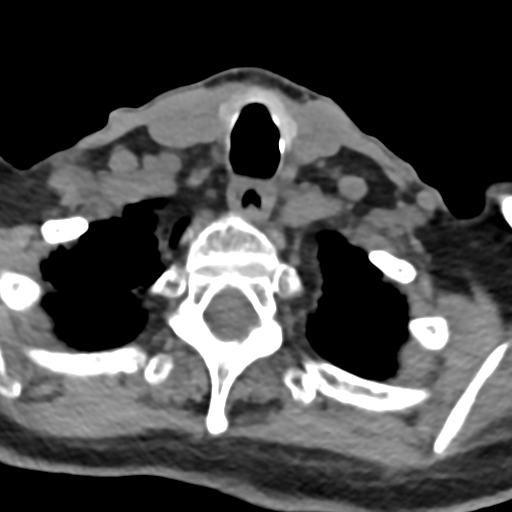
[im 16/101  bone]
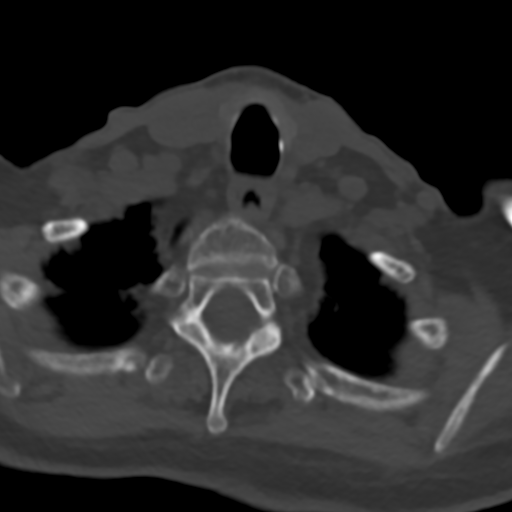
[im 39/101  bone]
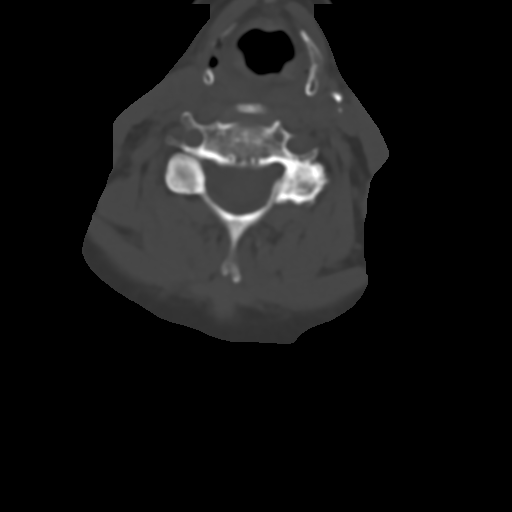
[im 62/101  bone]
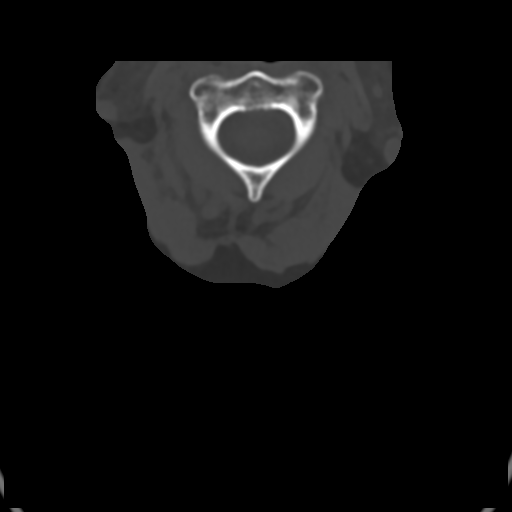
[im 85/101  bone]
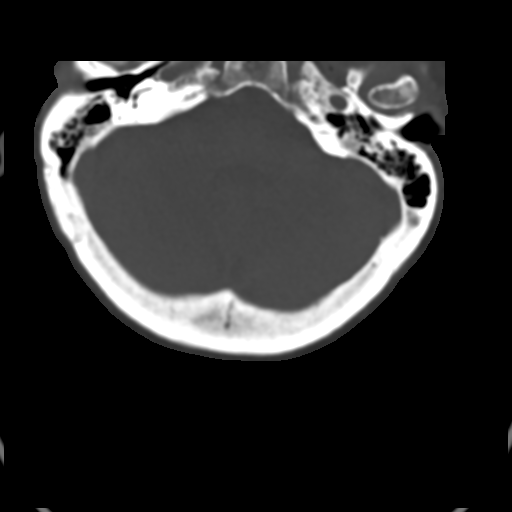

[Series 7: sag bone · sagittal · 0.31mm/px · 5 of 65 slices shown, 6 images]
[im 22/65  bone]
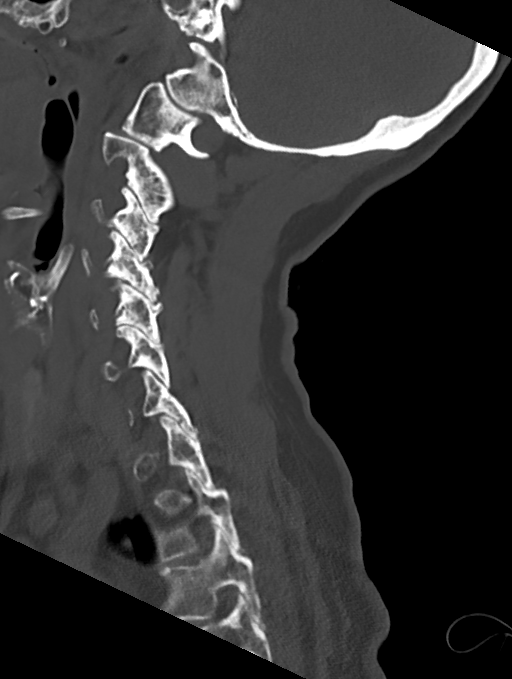
[im 27/65  bone]
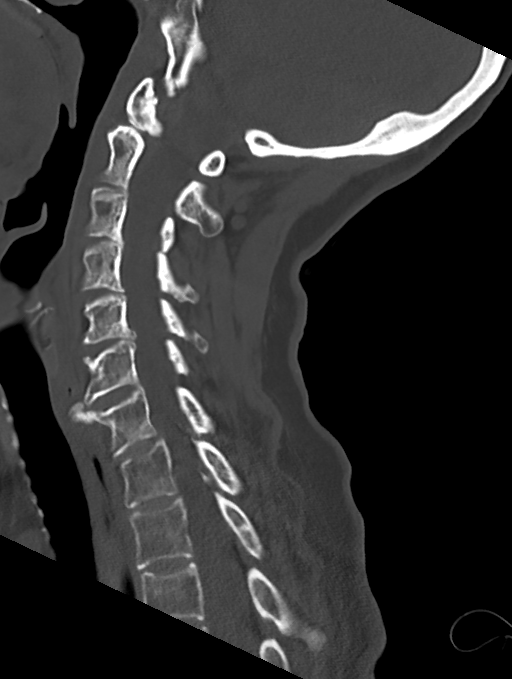
[im 33/65  soft-tissue]
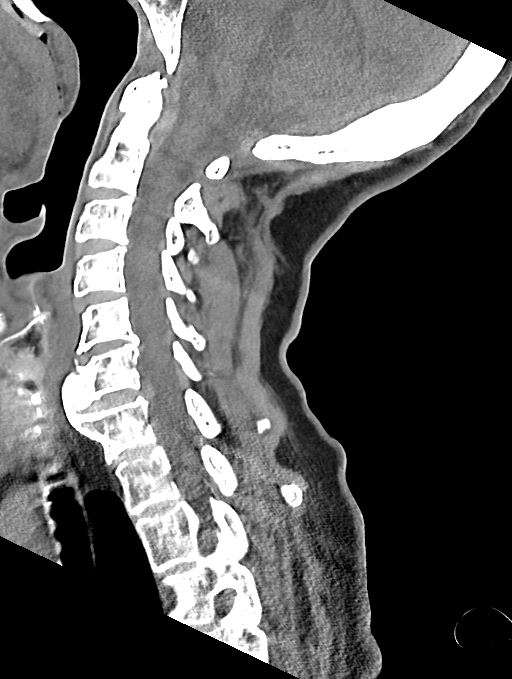
[im 33/65  bone]
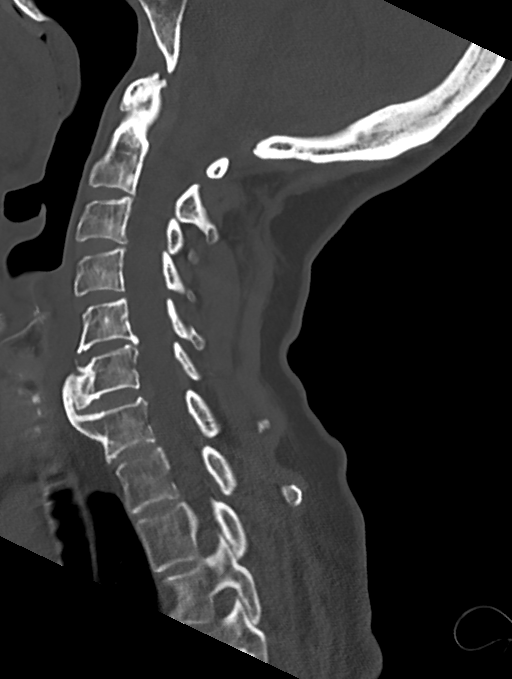
[im 38/65  bone]
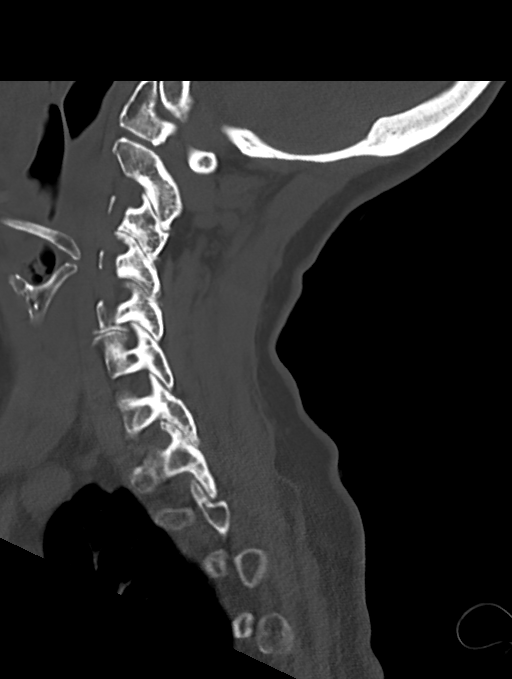
[im 43/65  bone]
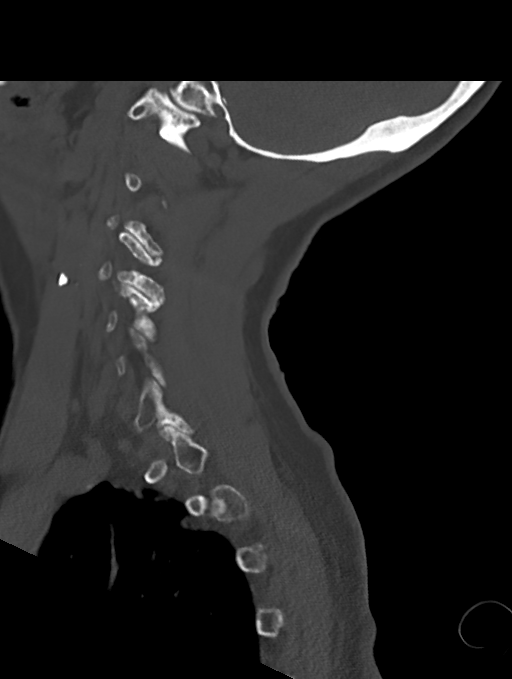

[Series 8: cor bone · coronal · 0.24mm/px · 3 of 77 slices shown]
[im 21/77  bone]
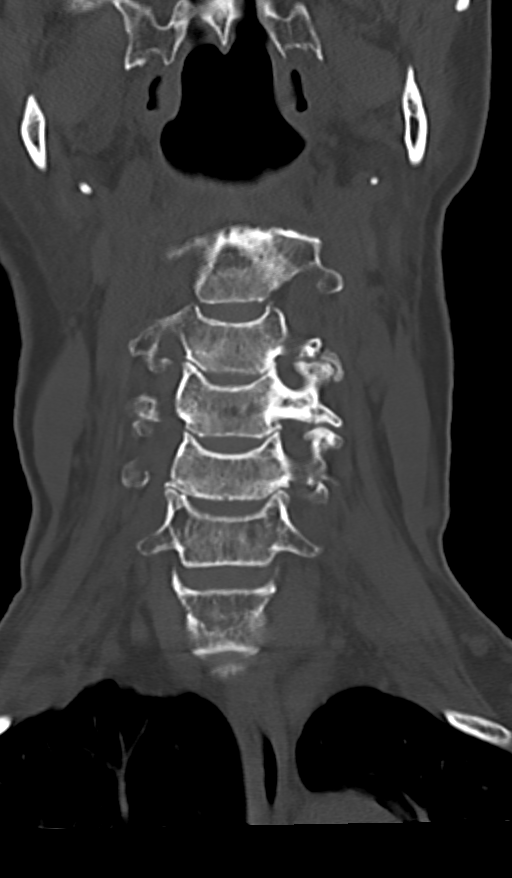
[im 33/77  bone]
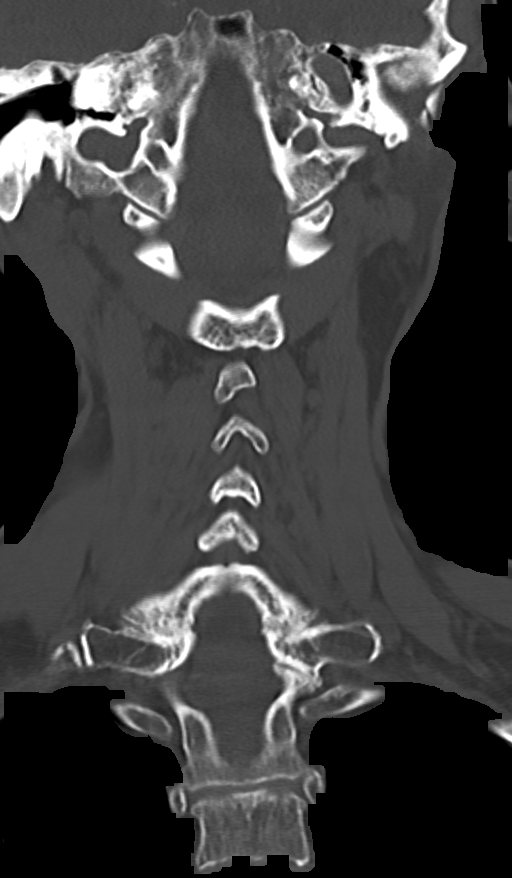
[im 44/77  bone]
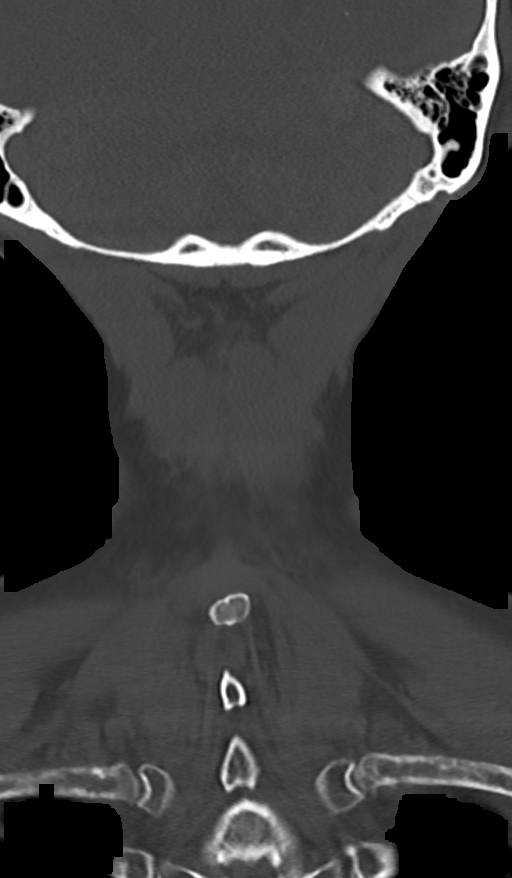

[12 of 33 positions shown; findings below may reference images not displayed]

FINDINGS: CT HEAD FINDINGS

Brain: No acute territorial infarction, hemorrhage or intracranial
mass. The ventricles are nonenlarged. Minimal chronic small vessel
ischemic change of the white matter

Vascular: No hyperdense vessels.  Carotid vascular calcification

Skull: Normal. Negative for fracture or focal lesion.

Sinuses/Orbits: No acute finding.

Other: None

CT CERVICAL SPINE FINDINGS

Alignment: No subluxation.  Facet alignment within normal limits.

Skull base and vertebrae: No acute fracture. No primary bone lesion
or focal pathologic process.

Soft tissues and spinal canal: No prevertebral fluid or swelling. No
visible canal hematoma.

Disc levels: Disc space narrowing and osteophyte C5-C6 and C6-C7.
Facet degenerative changes at multiple levels.

Upper chest: Negative.

Other: None
IMPRESSION: 1. No CT evidence for acute intracranial abnormality. Mild chronic
small vessel ischemic changes of the white matter
2. No acute osseous abnormality of the cervical spine

## 2022-09-15 LAB — LAB REPORT - SCANNED

## 2022-11-01 ENCOUNTER — Other Ambulatory Visit: Payer: Self-pay

## 2022-11-01 DIAGNOSIS — G4700 Insomnia, unspecified: Secondary | ICD-10-CM | POA: Insufficient documentation

## 2022-11-01 DIAGNOSIS — I714 Abdominal aortic aneurysm, without rupture, unspecified: Secondary | ICD-10-CM | POA: Insufficient documentation

## 2022-11-01 DIAGNOSIS — E782 Mixed hyperlipidemia: Secondary | ICD-10-CM | POA: Insufficient documentation

## 2022-11-01 DIAGNOSIS — I7090 Unspecified atherosclerosis: Secondary | ICD-10-CM | POA: Insufficient documentation

## 2022-11-01 DIAGNOSIS — D649 Anemia, unspecified: Secondary | ICD-10-CM | POA: Insufficient documentation

## 2022-11-01 DIAGNOSIS — N189 Chronic kidney disease, unspecified: Secondary | ICD-10-CM | POA: Insufficient documentation

## 2022-11-01 DIAGNOSIS — M47816 Spondylosis without myelopathy or radiculopathy, lumbar region: Secondary | ICD-10-CM | POA: Insufficient documentation

## 2022-11-01 DIAGNOSIS — I272 Pulmonary hypertension, unspecified: Secondary | ICD-10-CM | POA: Insufficient documentation

## 2022-11-01 DIAGNOSIS — M479 Spondylosis, unspecified: Secondary | ICD-10-CM | POA: Insufficient documentation

## 2022-11-01 DIAGNOSIS — D259 Leiomyoma of uterus, unspecified: Secondary | ICD-10-CM | POA: Insufficient documentation

## 2022-11-01 DIAGNOSIS — D509 Iron deficiency anemia, unspecified: Secondary | ICD-10-CM | POA: Insufficient documentation

## 2022-11-03 ENCOUNTER — Encounter: Payer: Self-pay | Admitting: Cardiology

## 2022-11-03 ENCOUNTER — Ambulatory Visit: Payer: Medicare HMO | Attending: Cardiology | Admitting: Cardiology

## 2022-11-03 ENCOUNTER — Ambulatory Visit: Payer: Medicare HMO | Attending: Cardiology

## 2022-11-03 VITALS — BP 138/82 | HR 68 | Ht 61.0 in | Wt 106.2 lb

## 2022-11-03 DIAGNOSIS — R002 Palpitations: Secondary | ICD-10-CM

## 2022-11-03 DIAGNOSIS — E782 Mixed hyperlipidemia: Secondary | ICD-10-CM | POA: Diagnosis not present

## 2022-11-03 DIAGNOSIS — I351 Nonrheumatic aortic (valve) insufficiency: Secondary | ICD-10-CM

## 2022-11-03 DIAGNOSIS — I7090 Unspecified atherosclerosis: Secondary | ICD-10-CM | POA: Diagnosis not present

## 2022-11-03 NOTE — Patient Instructions (Signed)
Please keep a BP log for 2 weeks and send by MyChart or mail.  Blood Pressure Record Sheet To take your blood pressure, you will need a blood pressure machine. You can buy a blood pressure machine (blood pressure monitor) at your clinic, drug store, or online. When choosing one, consider: An automatic monitor that has an arm cuff. A cuff that wraps snugly around your upper arm. You should be able to fit only one finger between your arm and the cuff. A device that stores blood pressure reading results. Do not choose a monitor that measures your blood pressure from your wrist or finger. Follow your health care provider's instructions for how to take your blood pressure. To use this form: Get one reading in the morning (a.m.) 1-2 hours after you take any medicines. Get one reading in the evening (p.m.) before supper.   Blood pressure log Date: _______________________  a.m. _____________________(1st reading) HR___________            p.m. _____________________(2nd reading) HR__________  Date: _______________________  a.m. _____________________(1st reading) HR___________            p.m. _____________________(2nd reading) HR__________  Date: _______________________  a.m. _____________________(1st reading) HR___________            p.m. _____________________(2nd reading) HR__________  Date: _______________________  a.m. _____________________(1st reading) HR___________            p.m. _____________________(2nd reading) HR__________  Date: _______________________  a.m. _____________________(1st reading) HR___________            p.m. _____________________(2nd reading) HR__________  Date: _______________________  a.m. _____________________(1st reading) HR___________            p.m. _____________________(2nd reading) HR__________  Date: _______________________  a.m. _____________________(1st reading) HR___________            p.m. _____________________(2nd reading)  HR__________   This information is not intended to replace advice given to you by your health care provider. Make sure you discuss any questions you have with your health care provider. Document Revised: 04/11/2019 Document Reviewed: 04/11/2019 Elsevier Patient Education  2021 Elsevier Inc.   Medication Instructions:  Your physician recommends that you continue on your current medications as directed. Please refer to the Current Medication list given to you today.  *If you need a refill on your cardiac medications before your next appointment, please call your pharmacy*   Lab Work: None ordered If you have labs (blood work) drawn today and your tests are completely normal, you will receive your results only by: MyChart Message (if you have MyChart) OR A paper copy in the mail If you have any lab test that is abnormal or we need to change your treatment, we will call you to review the results.   Testing/Procedures: A zio monitor was ordered today. It will remain on for 14 days. Remove 11/17/22. You will then return monitor and event diary in provided box. It takes 1-2 weeks for report to be downloaded and returned to [place]. We will call you with the results. If monitor falls off or has orange flashing light, please call Zio for further instructions.    Follow-Up: At [facility], you and your health needs are our priority.  As part of our continuing mission to provide you with exceptional heart care, we have created designated Provider Care Teams.  These Care Teams include your primary Cardiologist (physician) and Advanced Practice Providers (APPs -  Physician Assistants and Nurse Practitioners) who all work together to provide you with the care you  need, when you need it.  We recommend signing up for the patient portal called "MyChart".  Sign up information is provided on this After Visit Summary.  MyChart is used to connect with patients for Virtual Visits (Telemedicine).   Patients are able to view lab/test results, encounter notes, upcoming appointments, etc.  Non-urgent messages can be sent to your provider as well.   To learn more about what you can do with MyChart, go to [email or web address].    Your next appointment:   9 month(s)  The format for your next appointment:   In Person  Provider:   Belva Crome, MD    Other Instructions none  Important Information About Sugar

## 2022-11-03 NOTE — Addendum Note (Signed)
Addended by: Eleonore Chiquito on: 11/03/2022 03:32 PM   Modules accepted: Orders

## 2022-11-03 NOTE — Progress Notes (Signed)
Cardiology Office Note:    Date:  11/03/2022   ID:  Rachel Bass, DOB 1934/10/19, MRN [id number]  PCP:  Rachel Handler, NP  Cardiologist:  Rachel Brothers, MD   Referring MD: Rachel Handler, NP    ASSESSMENT:    1. Mixed dyslipidemia   2. Palpitations   3. Mild aortic regurgitation   4. Atherosclerosis    PLAN:    In order of problems listed above:  Primary prevention stressed with the patient.  Importance of compliance with diet medication stressed and patient verbalized standing. Palpitations: I discussed this with her.  I discussed 2-week monitoring and she is agreeable. Mild aortic regurgitation: Stable and we will continue to monitor. Mixed dyslipidemia: On lipid-lowering medications followed by primary care.  Goal LDL should be less than 60. Aortic atherosclerosis: Stable followed by primary care and again secondary prevention stressed also results of various echocardiogram reviewed and discussed with patient. Patient will be seen in follow-up appointment in 6 months or earlier if the patient has any concerns.    Medication Adjustments/Labs and Tests Ordered: Current medicines are reviewed at length with the patient today.  Concerns regarding medicines are outlined above.  Orders Placed This Encounter  Procedures   EKG 12-Lead   No orders of the defined types were placed in this encounter.    History of Present Illness:    Rachel Bass is a 88 y.o. female who is being seen today for the evaluation of palpitations.  At the request of Rachel Handler, NP.  Patient has past medical history of aortic atherosclerosis, mixed dyslipidemia and  palpitations.  She mentions to me that she has no chest pain orthopnea or PND.  She is able to ambulate age appropriately.  No dizziness or syncope.  At the time of my evaluation, the patient is alert awake oriented and in no distress.  Past Medical History:  Diagnosis Date   Abdominal aortic aneurysm (AAA) (HCC)     Anemia    Anxiety and depression 08/13/2020   Aortic atherosclerosis (HCC) 07/29/2020   Formatting of this note might be different from the original.  Seen on xrays.     Atherosclerosis    Chronic fatigue and malaise 08/06/2015   Chronic renal impairment    Chronic vertigo 09/23/2020   Degenerative lumbar disc 08/13/2020   Gastroesophageal reflux disease without esophagitis 03/15/2018   Insomnia    Iron deficiency anemia    Left elbow fracture, sequela 08/25/2020   Lumbar spondylosis    Mixed dyslipidemia    Osteoarthritis of spine    Primary hypertension 08/06/2015   Pulmonary hypertension, mild (HCC)    Subclinical hypothyroidism    Unintentional weight loss 02/26/2016   Uterine leiomyoma     Past Surgical History:  Procedure Laterality Date   NO PAST SURGERIES      Current Medications: Current Meds  Medication Sig   ascorbic acid (VITAMIN C) 1000 MG tablet Take 1,000 mg by mouth daily.   CALCIUM MAGNESIUM ZINC PO Take 1 tablet by mouth daily.   cholecalciferol (D3 HIGH POTENCY) 10 MCG (400 UNIT) TABS tablet Take 400 Units by mouth 2 (two) times a week.   Coenzyme Q10 (COQ10) 200 MG CAPS Take 200 mg by mouth daily.   Cranberry 500 MG TABS Take 500 mg by mouth daily.   Ferrous Sulfate (IRON) 325 (65 Fe) MG TABS Take 1 tablet by mouth 2 (two) times a week.   meclizine (ANTIVERT) 12.5 MG tablet Take 2-3  tablets by mouth as needed for dizziness.   Multiple Vitamin (MULTIVITAMIN) capsule Take 1 capsule by mouth daily.   simvastatin (ZOCOR) 20 MG tablet Take 20 mg by mouth daily.     Allergies:   Patient has no known allergies.   Social History   Socioeconomic History   Marital status: Married    Spouse name: Not on file   Number of children: Not on file   Years of education: Not on file   Highest education level: Not on file  Occupational History   Not on file  Tobacco Use   Smoking status: Never   Smokeless tobacco: Never  Substance and Sexual Activity    Alcohol use: Never   Drug use: Never   Sexual activity: Not on file  Other Topics Concern   Not on file  Social History Narrative   Not on file   Social Determinants of Health   Financial Resource Strain: Not on file  Food Insecurity: Not on file  Transportation Needs: Not on file  Physical Activity: Not on file  Stress: Not on file  Social Connections: Not on file     Family History: The patient's family history is negative for Hypertension, Heart disease, Diabetes, and Cancer.  ROS:   Please see the history of present illness.    All other systems reviewed and are negative.  EKGs/Labs/Other Studies Reviewed:    The following studies were reviewed today:  EKG Interpretation Date/Time:  Wednesday November 03 2022 15:02:25 EDT Ventricular Rate:  68 PR Interval:  184 QRS Duration:  78 QT Interval:  400 QTC Calculation: 425 R Axis:   104  Text Interpretation: Normal sinus rhythm Rightward axis Borderline ECG When compared with ECG of 22-Aug-2020 18:38, PREVIOUS ECG IS PRESENT Confirmed by Rachel Bass [phone]) on 11/03/2022 3:10:22 PM     Recent Labs: No results found for requested labs within last 365 days.  Recent Lipid Panel No results found for: "CHOL", "TRIG", "HDL", "CHOLHDL", "VLDL", "LDLCALC", "LDLDIRECT"  Physical Exam:    VS:  BP (!) 152/98   Pulse 68   Ht 5\' 1"  (1.549 m)   Wt 106 lb 3.2 oz (48.2 kg)   SpO2 94%   BMI 20.07 kg/m     Wt Readings from Last 3 Encounters:  11/03/22 106 lb 3.2 oz (48.2 kg)  08/22/20 104 lb (47.2 kg)     GEN: Patient is in no acute distress HEENT: Normal NECK: No JVD; No carotid bruits LYMPHATICS: No lymphadenopathy CARDIAC: S1 S2 regular, 2/6 systolic murmur at the apex. RESPIRATORY:  Clear to auscultation without rales, wheezing or rhonchi  ABDOMEN: Soft, non-tender, non-distended MUSCULOSKELETAL:  No edema; No deformity  SKIN: Warm and dry NEUROLOGIC:  Alert and oriented x 3 PSYCHIATRIC:  Normal affect     Signed, Rachel Brothers, MD  11/03/2022 3:18 PM    [facility]

## 2023-01-06 ENCOUNTER — Telehealth: Payer: Self-pay | Admitting: Cardiology

## 2023-01-17 NOTE — Telephone Encounter (Signed)
 Left vm to return call.

## 2023-01-18 NOTE — Telephone Encounter (Signed)
 Left vm to return call.

## 2023-01-19 MED ORDER — METOPROLOL SUCCINATE ER 25 MG PO TB24: 12.5000 mg | ORAL_TABLET | Freq: Every day | ORAL | 3 refills | Status: DC

## 2023-01-19 NOTE — Telephone Encounter (Signed)
 Lvm to return call. Will mail letter.

## 2023-01-21 ENCOUNTER — Telehealth: Payer: Self-pay | Admitting: Cardiology

## 2023-01-21 MED ORDER — METOPROLOL SUCCINATE ER 25 MG PO TB24: 12.5000 mg | ORAL_TABLET | Freq: Every day | ORAL | 3 refills | Status: AC

## 2023-01-21 NOTE — Telephone Encounter (Signed)
Spoke to patient she was calling for monitor results done 10/30.Advised I will send message to Dr.Revankar.

## 2023-01-21 NOTE — Telephone Encounter (Signed)
Spoke to patient she stated she is not taking Metoprolol.She does not know when she stopped taking.Advised I will send message to Dr.Munley for advice on dose.

## 2023-01-21 NOTE — Telephone Encounter (Signed)
Pt's spouse is requesting a callback regarding monitor results. Please advise

## 2023-01-21 NOTE — Telephone Encounter (Signed)
Spoke to patient Dr.Munley's advice given.She will restart Metoprolol Succinate 25 mg 1/2 tablet daily.Appointment scheduled with Dr.Revankar 3/5 at 3:20 pm.

## 2023-02-04 ENCOUNTER — Ambulatory Visit: Payer: Medicare HMO | Admitting: Cardiology

## 2023-03-08 ENCOUNTER — Encounter: Payer: Self-pay | Admitting: Cardiology

## 2023-03-09 ENCOUNTER — Encounter: Payer: Self-pay | Admitting: Cardiology

## 2023-03-09 ENCOUNTER — Ambulatory Visit: Payer: Medicare HMO | Attending: Cardiology | Admitting: Cardiology

## 2023-03-09 VITALS — BP 152/82 | HR 84 | Ht 61.0 in | Wt 108.2 lb

## 2023-03-09 DIAGNOSIS — E782 Mixed hyperlipidemia: Secondary | ICD-10-CM

## 2023-03-09 DIAGNOSIS — I351 Nonrheumatic aortic (valve) insufficiency: Secondary | ICD-10-CM | POA: Diagnosis not present

## 2023-03-09 DIAGNOSIS — I700 Atherosclerosis of aorta: Secondary | ICD-10-CM | POA: Diagnosis not present

## 2023-03-09 NOTE — Addendum Note (Signed)
 Addended by: Lonia Farber on: 03/09/2023 03:56 PM   Modules accepted: Orders

## 2023-03-09 NOTE — Patient Instructions (Addendum)
 Medication Instructions:  Your physician recommends that you continue on your current medications as directed. Please refer to the Current Medication list given to you today.  *If you need a refill on your cardiac medications before your next appointment, please call your pharmacy*   Lab Work: None Ordered If you have labs (blood work) drawn today and your tests are completely normal, you will receive your results only by: MyChart Message (if you have MyChart) OR A paper copy in the mail If you have any lab test that is abnormal or we need to change your treatment, we will call you to review the results.   Testing/Procedures: Your physician has requested that you have an abdominal aorta duplex. During this test, an ultrasound is used to evaluate the aorta. Allow 30 minutes for this exam. Do not eat after midnight the day before and avoid carbonated beverages.  Please note: We ask at that you not bring children with you during ultrasound (echo/ vascular) testing. Due to room size and safety concerns, children are not allowed in the ultrasound rooms during exams. Our front office staff cannot provide observation of children in our lobby area while testing is being conducted. An adult accompanying a patient to their appointment will only be allowed in the ultrasound room at the discretion of the ultrasound technician under special circumstances. We apologize for any inconvenience.    Follow-Up: At [facility], you and your health needs are our priority.  As part of our continuing mission to provide you with exceptional heart care, we have created designated Provider Care Teams.  These Care Teams include your primary Cardiologist (physician) and Advanced Practice Providers (APPs -  Physician Assistants and Nurse Practitioners) who all work together to provide you with the care you need, when you need it.  We recommend signing up for the patient portal called "MyChart".  Sign up information is  provided on this After Visit Summary.  MyChart is used to connect with patients for Virtual Visits (Telemedicine).  Patients are able to view lab/test results, encounter notes, upcoming appointments, etc.  Non-urgent messages can be sent to your provider as well.   To learn more about what you can do with MyChart, go to [email or web address].    Your next appointment:   12 month follow up

## 2023-03-09 NOTE — Progress Notes (Signed)
 Cardiology Office Note:    Date:  03/09/2023   ID:  Rachel Bass, DOB 1934-12-04, MRN [id number]  PCP:  Julianne Handler, NP  Cardiologist:  Garwin Brothers, MD   Referring MD: Julianne Handler, NP    ASSESSMENT:    1. Aortic atherosclerosis (HCC)   2. Mixed dyslipidemia   3. Mild aortic regurgitation    PLAN:    In order of problems listed above:  Primary prevention stressed with the patient.  Importance of compliance with diet medication stressed and patient verbalized standing. Essential hypertension: Blood pressure is stable and diet was emphasized.  Lifestyle modification urged.  She has anxiety issues.  She has an element of whitecoat hypertension.  She mentioned to me blood pressures at home and they are fine. Mixed dyslipidemia: On statin therapy followed by primary care.  Goal LDL is less than 60.  She has aortic atherosclerosis Aortic atherosclerosis: Stable and we will continue to monitor. Patient will be seen in follow-up appointment in 12 months or earlier if the patient has any concerns.    Medication Adjustments/Labs and Tests Ordered: Current medicines are reviewed at length with the patient today.  Concerns regarding medicines are outlined above.  No orders of the defined types were placed in this encounter.  No orders of the defined types were placed in this encounter.    Chief Complaint  Patient presents with   Follow-up     History of Present Illness:    Rachel Bass is a 89 y.o. female.  Patient has past medical history of essential hypertension, anxiety, mixed dyslipidemia and palpitations.  She has had history of SVT in the past.  Monitoring revealed brief spells of atrial runs.  She denies any chest pain orthopnea or PND.  This is better with beta-blockers now.  At the time of my evaluation, the patient is alert awake oriented and in no distress.  Her husband accompanies her for the visit.  Past Medical History:  Diagnosis Date   Abdominal  aortic aneurysm (AAA) (HCC)    Anemia    Anxiety and depression 08/13/2020   Aortic atherosclerosis (HCC) 07/29/2020   Formatting of this note might be different from the original.  Seen on xrays.     Atherosclerosis    Chronic fatigue and malaise 08/06/2015   Chronic renal impairment    Chronic vertigo 09/23/2020   Degenerative lumbar disc 08/13/2020   Gastroesophageal reflux disease without esophagitis 03/15/2018   Insomnia    Iron deficiency anemia    Left elbow fracture, sequela 08/25/2020   Lumbar spondylosis    Mixed dyslipidemia    Osteoarthritis of spine    Primary hypertension 08/06/2015   Pulmonary hypertension, mild (HCC)    Subclinical hypothyroidism    Unintentional weight loss 02/26/2016   Uterine leiomyoma     Past Surgical History:  Procedure Laterality Date   NO PAST SURGERIES      Current Medications: Current Meds  Medication Sig   ascorbic acid (VITAMIN C) 1000 MG tablet Take 1,000 mg by mouth daily.   CALCIUM MAGNESIUM ZINC PO Take 1 tablet by mouth daily.   cholecalciferol (D3 HIGH POTENCY) 10 MCG (400 UNIT) TABS tablet Take 400 Units by mouth 2 (two) times a week.   Coenzyme Q10 (COQ10) 200 MG CAPS Take 200 mg by mouth daily.   Cranberry 500 MG TABS Take 500 mg by mouth daily.   Ferrous Sulfate (IRON) 325 (65 Fe) MG TABS Take 1 tablet by mouth 2 (  two) times a week.   meclizine (ANTIVERT) 12.5 MG tablet Take 2-3 tablets by mouth as needed for dizziness.   metoprolol succinate (TOPROL XL) 25 MG 24 hr tablet Take 0.5 tablets (12.5 mg total) by mouth daily.   Multiple Vitamin (MULTIVITAMIN) capsule Take 1 capsule by mouth daily.   simvastatin (ZOCOR) 20 MG tablet Take 20 mg by mouth daily.     Allergies:   Patient has no known allergies.   Social History   Socioeconomic History   Marital status: Married    Spouse name: Not on file   Number of children: Not on file   Years of education: Not on file   Highest education level: Not on file   Occupational History   Not on file  Tobacco Use   Smoking status: Never   Smokeless tobacco: Never  Substance and Sexual Activity   Alcohol use: Never   Drug use: Never   Sexual activity: Not on file  Other Topics Concern   Not on file  Social History Narrative   Not on file   Social Drivers of Health   Financial Resource Strain: Not on file  Food Insecurity: Not on file  Transportation Needs: Not on file  Physical Activity: Not on file  Stress: Not on file  Social Connections: Not on file     Family History: The patient's family history is negative for Hypertension, Heart disease, Diabetes, and Cancer.  ROS:   Please see the history of present illness.    All other systems reviewed and are negative.  EKGs/Labs/Other Studies Reviewed:    The following studies were reviewed today: I discussed with niece of the event monitor with the patient at length   Recent Labs: No results found for requested labs within last 365 days.  Recent Lipid Panel No results found for: "CHOL", "TRIG", "HDL", "CHOLHDL", "VLDL", "LDLCALC", "LDLDIRECT"  Physical Exam:    VS:  BP (!) 152/82 (BP Location: Right Arm, Patient Position: Sitting)   Pulse 84   Ht 5\' 1"  (1.549 m)   Wt 108 lb 3.2 oz (49.1 kg)   SpO2 97%   BMI 20.44 kg/m     Wt Readings from Last 3 Encounters:  03/09/23 108 lb 3.2 oz (49.1 kg)  11/03/22 106 lb 3.2 oz (48.2 kg)  08/22/20 104 lb (47.2 kg)     GEN: Patient is in no acute distress HEENT: Normal NECK: No JVD; No carotid bruits LYMPHATICS: No lymphadenopathy CARDIAC: Hear sounds regular, 2/6 systolic murmur at the apex. RESPIRATORY:  Clear to auscultation without rales, wheezing or rhonchi  ABDOMEN: Soft, non-tender, non-distended MUSCULOSKELETAL:  No edema; No deformity  SKIN: Warm and dry NEUROLOGIC:  Alert and oriented x 3 PSYCHIATRIC:  Normal affect   Signed, Garwin Brothers, MD  03/09/2023 3:37 PM    [facility]

## 2023-03-23 ENCOUNTER — Other Ambulatory Visit
